# Patient Record
Sex: Male | Born: 1975 | Marital: Married | Smoking: Never smoker
Health system: Southern US, Community
[De-identification: ages and names within clinical notes are randomized; demographics above are authoritative.]

## PROBLEM LIST (undated history)

## (undated) DIAGNOSIS — R200 Anesthesia of skin: Secondary | ICD-10-CM

## (undated) DIAGNOSIS — R29898 Other symptoms and signs involving the musculoskeletal system: Secondary | ICD-10-CM

## (undated) DIAGNOSIS — R202 Paresthesia of skin: Secondary | ICD-10-CM

## (undated) DIAGNOSIS — K0889 Other specified disorders of teeth and supporting structures: Secondary | ICD-10-CM

## (undated) DIAGNOSIS — I2510 Atherosclerotic heart disease of native coronary artery without angina pectoris: Secondary | ICD-10-CM

## (undated) DIAGNOSIS — R6883 Chills (without fever): Secondary | ICD-10-CM

## (undated) HISTORY — DX: Paresthesia of skin: R20.2

## (undated) HISTORY — DX: Other symptoms and signs involving the musculoskeletal system: R29.898

## (undated) HISTORY — DX: Other specified disorders of teeth and supporting structures: K08.89

## (undated) HISTORY — DX: Anesthesia of skin: R20.0

## (undated) HISTORY — PX: OTHER SURGICAL HISTORY: SHX169

## (undated) HISTORY — DX: Chills (without fever): R68.83

---

## 2018-02-08 ENCOUNTER — Emergency Department (HOSPITAL_COMMUNITY)
Admission: EM | Admit: 2018-02-08 | Discharge: 2018-02-08 | Disposition: A | Discharge status: Home / Self Care | Payer: BLUE CROSS/BLUE SHIELD | Attending: Emergency Medicine | Admitting: Emergency Medicine

## 2018-02-08 ENCOUNTER — Encounter (HOSPITAL_COMMUNITY): Payer: Self-pay | Admitting: *Deleted

## 2018-02-08 DIAGNOSIS — R1011 Right upper quadrant pain: Secondary | ICD-10-CM | POA: Diagnosis present

## 2018-02-08 DIAGNOSIS — I2510 Atherosclerotic heart disease of native coronary artery without angina pectoris: Secondary | ICD-10-CM | POA: Diagnosis not present

## 2018-02-08 DIAGNOSIS — M546 Pain in thoracic spine: Secondary | ICD-10-CM | POA: Insufficient documentation

## 2018-02-08 LAB — URINALYSIS, ROUTINE W REFLEX MICROSCOPIC
Bilirubin Urine: NEGATIVE
Glucose, UA: NEGATIVE mg/dL
Hgb urine dipstick: NEGATIVE
Ketones, ur: NEGATIVE mg/dL
LEUKOCYTES UA: NEGATIVE
NITRITE: NEGATIVE
PROTEIN: NEGATIVE mg/dL
Specific Gravity, Urine: 1.015 (ref 1.005–1.030)
pH: 5 (ref 5.0–8.0)

## 2018-02-08 NOTE — ED Notes (Signed)
Pt A&OX4, ambulatory at d/c with independent steady gait, NAD. Pt verbalized understanding of d/c instructions and follow up care. Pt states he has all of his belongings with him at d/c.

## 2018-02-08 NOTE — ED Provider Notes (Signed)
[facility]  CSN: [id number] Arrival date & time: 02/08/18 [id number]  Chief Complaint(s) Flank Pain  HPI Jake Schultz is a 42 y.o. male with a history of CAD status post stenting who presents to the ED with several months of mild intermittent right flank pain.  Patient describes the pain as a dull achiness, feels like he needs to pop his back.  Pain  at times radiates to the left groin.  He does notice pain more with certain positions and with twisting.  Denies any trauma or heavy lifting.  Denies any bowel/bladder incontinence.  No lower extremity weakness or loss of sensation.  Denies any history of cancer, IV drug use, instrumentation.  Denies any history of renal stones.  No dysuria or change in the urine color.  Denies any alleviating or aggravating factors.  Denies any other physical complaints.  HPI  Past Medical History History reviewed. No pertinent past medical history. There are no active problems to display for this patient.  Home Medication(s) Prior to Admission medications   Not on File                                                                                                                                    Past Surgical History Past Surgical History:  Procedure Laterality Date  . CARDIAC SURGERY     Family History No family history on file.  Social History Social History   Tobacco Use  . Smoking status: Not on file  Substance Use Topics  . Alcohol use: Not on file  . Drug use: Not on file   Allergies Patient has no known allergies.  Review of Systems Review of Systems All other systems are reviewed and are negative for acute change except as noted in the HPI  Physical Exam Vital Signs  I have reviewed the triage vital signs BP (!) 124/92 (BP Location: Right Arm)   Pulse 84   Temp 98.3 F (36.8 C) (Oral)   Resp 18   SpO2 99%   Physical Exam  Constitutional: He is oriented to person, place,  and time. He appears well-developed and well-nourished. No distress.  HENT:  Head: Normocephalic and atraumatic.  Right Ear: External ear normal.  Left Ear: External ear normal.  Nose: Nose normal.  Mouth/Throat: Mucous membranes are normal. No trismus in the jaw.  Eyes: Conjunctivae and EOM are normal. No scleral icterus.  Neck: Normal range of motion and phonation normal.  Cardiovascular: Normal rate and regular rhythm.  Pulmonary/Chest: Effort normal. No stridor. No respiratory distress.  Abdominal: He exhibits no distension. There is no tenderness. There is no rigidity, no rebound, no guarding and no CVA tenderness.  Musculoskeletal: Normal range of motion. He exhibits no edema.       Thoracic back: He exhibits pain. He exhibits no tenderness and no bony tenderness.       Back:  Neurological:  He is alert and oriented to person, place, and time.  Spine Exam: Strength: 5/5 throughout LE bilaterally  Sensation: Intact to light touch in proximal and distal LE bilaterally    Skin: He is not diaphoretic.  Psychiatric: He has a normal mood and affect. His behavior is normal.  Vitals reviewed.   ED Results and Treatments Labs (all labs ordered are listed, but only abnormal results are displayed) Labs Reviewed  URINALYSIS, ROUTINE W REFLEX MICROSCOPIC                                                                                                                         EKG  EKG Interpretation  Date/Time:    Ventricular Rate:    PR Interval:    QRS Duration:   QT Interval:    QTC Calculation:   R Axis:     Text Interpretation:        Radiology No results found. Pertinent labs & imaging results that were available during my care of the patient were reviewed by me and considered in my medical decision making (see chart for details).  Medications Ordered in ED Medications - No data to display                                                                                                                                   Procedures Procedures EMERGENCY DEPARTMENT US RENAL EXAM  "Study: Limited Retroperitoneal Ultrasound of Kidneys"  INDICATIONS: Flank pain Long and short axis of both kidneys were obtained.   PERFORMED BY: Myself IMAGES ARCHIVED?: Yes LIMITATIONS: Body habitus VIEWS USED: Long axis and Short axis  INTERPRETATION: No Hydronephrosis, No Renal cyst, No Kidney stone   EMERGENCY DEPARTMENT ULTRASOUND  Study: Limited Retroperitoneal Ultrasound of the Abdominal Aorta.  INDICATIONS:Back pain Multiple views of the abdominal aorta were obtained in real-time from the diaphragmatic hiatus to the aortic bifurcation in transverse planes with a multi-frequency probe.  PERFORMED BY: Myself IMAGES ARCHIVED?: Yes LIMITATIONS:  None INTERPRETATION:  No abdominal aortic aneurysm    (including critical care time)  Medical Decision Making / ED Course I have reviewed the nursing notes for this encounter and the patient's prior records (if available in EHR or on provided paperwork).    42 y.o. male presents with back pain in thoracolumbar area for 3 months without signs of radicular pain. No acute traumatic onset. No red flag symptoms of  fever, weight loss, saddle anesthesia, weakness, fecal/urinary incontinence or urinary retention.   UA is without evidence of hematuria or infection.  Bedside ultrasound without evidence of hydronephrosis or renal stones.  No evidence of AAA.  Suspect MSK etiology. No indication for imaging emergently. Patient was recommended to take short course of scheduled NSAIDs and engage in early mobility as definitive treatment. Return precautions discussed for worsening or new concerning symptoms.    Final Clinical Impression(s) / ED Diagnoses Final diagnoses:  Acute right-sided thoracic back pain    Disposition: Discharge  Condition: Good  I have discussed the results, Dx and Tx plan with the patient who expressed  understanding and agree(s) with the plan. Discharge instructions discussed at great length. The patient was given strict return precautions who verbalized understanding of the instructions. No further questions at time of discharge.    ED Discharge Orders    None       Follow Up: Primary care provider   in 5-7 days, If symptoms do not improve or  worsen     This chart was dictated using voice recognition software.  Despite best efforts to proofread,  errors can occur which can change the documentation meaning.   Nira Conn, MD 02/08/18 1039

## 2018-02-08 NOTE — ED Triage Notes (Signed)
Pt reports rt flank pain for 1 week. Pt reports diarrhea after drinking cranberry juice. Pt denies any urinary symptoms. Denies any recent back issues as well.

## 2018-10-05 ENCOUNTER — Emergency Department (HOSPITAL_COMMUNITY)
Admission: EM | Admit: 2018-10-05 | Discharge: 2018-10-05 | Disposition: A | Discharge status: Home / Self Care | Payer: BLUE CROSS/BLUE SHIELD | Attending: Emergency Medicine | Admitting: Emergency Medicine

## 2018-10-05 ENCOUNTER — Emergency Department (HOSPITAL_COMMUNITY): Payer: BLUE CROSS/BLUE SHIELD

## 2018-10-05 ENCOUNTER — Encounter (HOSPITAL_COMMUNITY): Payer: Self-pay | Admitting: *Deleted

## 2018-10-05 DIAGNOSIS — R1031 Right lower quadrant pain: Secondary | ICD-10-CM | POA: Insufficient documentation

## 2018-10-05 DIAGNOSIS — R1011 Right upper quadrant pain: Secondary | ICD-10-CM | POA: Insufficient documentation

## 2018-10-05 DIAGNOSIS — R109 Unspecified abdominal pain: Secondary | ICD-10-CM

## 2018-10-05 DIAGNOSIS — R1033 Periumbilical pain: Secondary | ICD-10-CM | POA: Diagnosis not present

## 2018-10-05 DIAGNOSIS — Z79899 Other long term (current) drug therapy: Secondary | ICD-10-CM | POA: Insufficient documentation

## 2018-10-05 LAB — LIPASE, BLOOD: LIPASE: 35 U/L (ref 11–51)

## 2018-10-05 LAB — COMPREHENSIVE METABOLIC PANEL
ALT: 43 U/L (ref 0–44)
AST: 29 U/L (ref 15–41)
Albumin: 4.2 g/dL (ref 3.5–5.0)
Alkaline Phosphatase: 71 U/L (ref 38–126)
Anion gap: 10 (ref 5–15)
BUN: 8 mg/dL (ref 6–20)
CHLORIDE: 102 mmol/L (ref 98–111)
CO2: 26 mmol/L (ref 22–32)
CREATININE: 0.92 mg/dL (ref 0.61–1.24)
Calcium: 9.6 mg/dL (ref 8.9–10.3)
GFR calc Af Amer: >60 mL/min (ref >60)
GFR calc non Af Amer: >60 mL/min (ref >60)
Glucose, Bld: 111 mg/dL — ABNORMAL HIGH (ref 70–99)
POTASSIUM: 4.4 mmol/L (ref 3.5–5.1)
Sodium: 138 mmol/L (ref 135–145)
Total Bilirubin: 1 mg/dL (ref 0.3–1.2)
Total Protein: 7.6 g/dL (ref 6.5–8.1)

## 2018-10-05 LAB — CBC
HCT: 49.7 % (ref 39.0–52.0)
HEMOGLOBIN: 17 g/dL (ref 13.0–17.0)
MCH: 29.7 pg (ref 26.0–34.0)
MCHC: 34.2 g/dL (ref 30.0–36.0)
MCV: 86.9 fL (ref 80.0–100.0)
NRBC: 0 % (ref 0.0–0.2)
Platelets: UNDETERMINED 10*3/uL (ref 150–400)
RBC: 5.72 MIL/uL (ref 4.22–5.81)
RDW: 11.7 % (ref 11.5–15.5)
WBC: 9.4 10*3/uL (ref 4.0–10.5)

## 2018-10-05 LAB — URINALYSIS, ROUTINE W REFLEX MICROSCOPIC
Bilirubin Urine: NEGATIVE
GLUCOSE, UA: NEGATIVE mg/dL
Hgb urine dipstick: NEGATIVE
KETONES UR: NEGATIVE mg/dL
LEUKOCYTES UA: NEGATIVE
NITRITE: NEGATIVE
PH: 6 (ref 5.0–8.0)
Protein, ur: NEGATIVE mg/dL
Specific Gravity, Urine: <1.005 — ABNORMAL LOW (ref 1.005–1.030)

## 2018-10-05 NOTE — ED Provider Notes (Signed)
[facility] EMERGENCY DEPARTMENT Provider Note   CSN: [id number] Arrival date & time: 10/05/18  4098     History   Chief Complaint Chief Complaint  Patient presents with  . Abdominal Pain  . Back Pain    HPI Jake Schultz is a 42 y.o. male presenting to the ED with complaint of 2 weeks of gradual onset intermittent RLQ/periumbilical abdominal pain. Pt states he has had chronic right sided mid back pain which is unchanged and hx of pulled groin muscle in the spring, though the abdominal pain is new. States it doesn't feel like a pain as much as a "pulled muscle." Sx worse with movement, comes and goes, radiates to RUQ. Intermittent mild nausea, no vomiting, diarrhea, constipation, fever, urinary sx. States he has occasional discomfort in his right testicle, though had a normal testicular U/S done 1 month ago for same sx. Sx are unchanged. He was told he may have a hernia and has been treating homeopathically with "reiki" therapy.  No hx of abdominal surgeries.   The history is provided by the patient.    History reviewed. No pertinent past medical history.  There are no active problems to display for this patient.   Past Surgical History:  Procedure Laterality Date  . CARDIAC SURGERY          Home Medications    Prior to Admission medications   Medication Sig Start Date End Date Taking? Authorizing Provider  Multiple Vitamins-Minerals (MULTIVITAMIN WITH MINERALS) tablet Take 1 tablet by mouth daily.   Yes [provider]    Family History History reviewed. No pertinent family history.  Social History Social History   Tobacco Use  . Smoking status: Not on file  Substance Use Topics  . Alcohol use: Not on file  . Drug use: Not on file     Allergies   Patient has no known allergies.   Review of Systems Review of Systems  Constitutional: Negative for appetite change and fever.  Gastrointestinal: Positive for abdominal pain and  nausea. Negative for blood in stool, constipation, diarrhea and vomiting.  Genitourinary: Positive for testicular pain. Negative for discharge, dysuria, frequency, penile pain and scrotal swelling.  Musculoskeletal: Positive for back pain.  All other systems reviewed and are negative.    Physical Exam Updated Vital Signs BP (!) 131/94   Pulse 73   Temp 98.2 F (36.8 C) (Oral)   Resp 18   Ht 5\' 8"  (1.727 m)   Wt 87.1 kg   SpO2 97%   BMI 29.19 kg/m   Physical Exam  Constitutional: He appears well-developed and well-nourished.  Non-toxic appearance. He does not appear ill. No distress.  HENT:  Head: Normocephalic and atraumatic.  Eyes: Conjunctivae are normal.  Cardiovascular: Normal rate, regular rhythm and normal heart sounds.  Pulmonary/Chest: Effort normal and breath sounds normal. No respiratory distress.  Abdominal: Soft. Normal appearance and bowel sounds are normal. He exhibits no distension and no mass. There is tenderness in the right upper quadrant, right lower quadrant and periumbilical area. There is positive Murphy's sign. There is no rigidity, no rebound and no guarding. A hernia is present. Hernia confirmed positive in the right inguinal area (possible small right inguinal hernia, nontender).  Genitourinary: Right testis shows no mass, no swelling and no tenderness. Left testis shows no mass, no swelling and no tenderness. Uncircumcised.  Neurological: He is alert.  Skin: Skin is warm.  Psychiatric: He has a normal mood and affect. His behavior is  normal.  Nursing note and vitals reviewed.    ED Treatments / Results  Labs (all labs ordered are listed, but only abnormal results are displayed) Labs Reviewed  COMPREHENSIVE METABOLIC PANEL - Abnormal; Notable for the following components:      Result Value   Glucose, Bld 111 (*)    All other components within normal limits  URINALYSIS, ROUTINE W REFLEX MICROSCOPIC - Abnormal; Notable for the following components:     Specific Gravity, Urine <1.005 (*)    All other components within normal limits  LIPASE, BLOOD  CBC    EKG None  Radiology US Abdomen Limited Ruq  Result Date: 10/05/2018 CLINICAL DATA:  42 year old male with abdominal pain for 2.5 months. EXAM: ULTRASOUND ABDOMEN LIMITED RIGHT UPPER QUADRANT COMPARISON:  Report of [facility] CT Abdomen and Pelvis 03/22/2012. FINDINGS: Gallbladder: No gallstones or wall thickening visualized. No sonographic Murphy sign noted by sonographer. Common bile duct: Diameter: 2 millimeters, normal. Liver: Echogenic liver (image 23). No discrete liver lesion. No intrahepatic biliary ductal dilatation. Portal vein is patent on color Doppler imaging with normal direction of blood flow towards the liver. Other findings: Negative visible right kidney. IMPRESSION: 1. Fatty liver disease. 2. Normal gallbladder.  No evidence of bile duct obstruction. Electronically Signed   By: Odessa Fleming M.D.   On: 10/05/2018 13:52    Procedures Procedures (including critical care time)  Medications Ordered in ED Medications - No data to display   Initial Impression / Assessment and Plan / ED Course  I have reviewed the triage vital signs and the nursing notes.  Pertinent labs & imaging results that were available during my care of the patient were reviewed by me and considered in my medical decision making (see chart for details).     Patient with 2 weeks of intermittent mild periumbilical/right lower quadrant/right upper quadrant abdominal pain that is worse with movement.  States it feels like a pulled muscle.  No other abdominal complaints.  Patient may have small inguinal hernia on the right on exam.  Recent normal testicular/scrotal ultrasound and no new symptoms regarding.  Exam is nonfocal without peritoneal signs.  Labs without leukocytosis, CMP within normal limits, lipase within normal limits, UA is negative.  Vital signs are stable.  Right upper quadrant  ultrasound to evaluate for possible cholelithiasis is negative.  Patient discussed with and evaluated by Dr. Rhunette Croft.  Had shared decision making with patient regarding nonemergent CT scan for diagnosis.  Do not see evidence of acute abdomen today in the emergency department, and feel comfortable with discharge and outpatient CT scan for further evaluation.  Patient agreeable to plan with discharge and outpatient CT versus CT today in the emergency department.  Will provide primary care referral as needed.  Discussed strict return precautions, and patient verbalized understanding of these precautions and agrees to plan for discharge.  Discussed results, findings, treatment and follow up. Patient advised of return precautions. Patient verbalized understanding and agreed with plan.  Final Clinical Impressions(s) / ED Diagnoses   Final diagnoses:  Abdominal pain    ED Discharge Orders    None       Colin Ellers, [place] N, PA-C 10/05/18 1527    Derwood Kaplan, MD 10/07/18 [phone]

## 2018-10-05 NOTE — Discharge Instructions (Signed)
You can take Tylenol or ibuprofen as needed for abdominal pain. Schedule appointment with your primary care provider for further evaluation and possible outpatient CT scan. Return to the emergency department immediately if you develop fever, uncontrollable vomiting, if you stop having bowel movements and passing gas, severely worsening abdominal pain, or new or concerning symptoms.

## 2018-10-05 NOTE — ED Triage Notes (Signed)
Pt in c/o right lower quad abdominal pain and right flank pain, symptoms have been going on for the last month and he has been seem multiples times for this, symptoms are not improving and they have not found a reason for symptoms, no distress noted

## 2018-10-05 NOTE — ED Notes (Signed)
Patient transported to Ultrasound 

## 2018-10-05 NOTE — ED Notes (Signed)
Patient verbalizes understanding of discharge instructions. Opportunity for questioning and answers were provided. Armband removed by staff, pt discharged from ED. Pt ambulatory to lobby.  

## 2018-11-25 ENCOUNTER — Emergency Department (HOSPITAL_COMMUNITY): Payer: BLUE CROSS/BLUE SHIELD

## 2018-11-25 ENCOUNTER — Encounter (HOSPITAL_COMMUNITY): Payer: Self-pay | Admitting: Emergency Medicine

## 2018-11-25 ENCOUNTER — Other Ambulatory Visit: Payer: Self-pay

## 2018-11-25 ENCOUNTER — Observation Stay (HOSPITAL_COMMUNITY)
Admission: EM | Admit: 2018-11-25 | Discharge: 2018-11-27 | Disposition: A | Discharge status: Home / Self Care | Payer: BLUE CROSS/BLUE SHIELD | Attending: Surgery | Admitting: Surgery

## 2018-11-25 DIAGNOSIS — Z955 Presence of coronary angioplasty implant and graft: Secondary | ICD-10-CM | POA: Diagnosis not present

## 2018-11-25 DIAGNOSIS — I2510 Atherosclerotic heart disease of native coronary artery without angina pectoris: Secondary | ICD-10-CM | POA: Insufficient documentation

## 2018-11-25 DIAGNOSIS — Z79899 Other long term (current) drug therapy: Secondary | ICD-10-CM | POA: Insufficient documentation

## 2018-11-25 DIAGNOSIS — R1031 Right lower quadrant pain: Secondary | ICD-10-CM

## 2018-11-25 DIAGNOSIS — I10 Essential (primary) hypertension: Secondary | ICD-10-CM | POA: Diagnosis not present

## 2018-11-25 DIAGNOSIS — K37 Unspecified appendicitis: Secondary | ICD-10-CM | POA: Diagnosis present

## 2018-11-25 DIAGNOSIS — Z7902 Long term (current) use of antithrombotics/antiplatelets: Secondary | ICD-10-CM | POA: Insufficient documentation

## 2018-11-25 DIAGNOSIS — K3580 Unspecified acute appendicitis: Principal | ICD-10-CM | POA: Insufficient documentation

## 2018-11-25 DIAGNOSIS — Z01810 Encounter for preprocedural cardiovascular examination: Secondary | ICD-10-CM

## 2018-11-25 HISTORY — DX: Atherosclerotic heart disease of native coronary artery without angina pectoris: I25.10

## 2018-11-25 LAB — COMPREHENSIVE METABOLIC PANEL
ALBUMIN: 4.2 g/dL (ref 3.5–5.0)
ALT: 34 U/L (ref 0–44)
ANION GAP: 8 (ref 5–15)
AST: 28 U/L (ref 15–41)
Alkaline Phosphatase: 67 U/L (ref 38–126)
BILIRUBIN TOTAL: 0.8 mg/dL (ref 0.3–1.2)
BUN: 13 mg/dL (ref 6–20)
CHLORIDE: 104 mmol/L (ref 98–111)
CO2: 24 mmol/L (ref 22–32)
Calcium: 9.5 mg/dL (ref 8.9–10.3)
Creatinine, Ser: 1.04 mg/dL (ref 0.61–1.24)
GFR calc Af Amer: >60 mL/min (ref >60)
Glucose, Bld: 113 mg/dL — ABNORMAL HIGH (ref 70–99)
POTASSIUM: 3.9 mmol/L (ref 3.5–5.1)
Sodium: 136 mmol/L (ref 135–145)
TOTAL PROTEIN: 7.3 g/dL (ref 6.5–8.1)

## 2018-11-25 LAB — URINALYSIS, ROUTINE W REFLEX MICROSCOPIC
Bilirubin Urine: NEGATIVE
Glucose, UA: NEGATIVE mg/dL
Hgb urine dipstick: NEGATIVE
Ketones, ur: NEGATIVE mg/dL
LEUKOCYTES UA: NEGATIVE
Nitrite: NEGATIVE
PROTEIN: NEGATIVE mg/dL
Specific Gravity, Urine: 1.016 (ref 1.005–1.030)
pH: 6 (ref 5.0–8.0)

## 2018-11-25 LAB — CBC WITH DIFFERENTIAL/PLATELET
Abs Immature Granulocytes: 0.07 10*3/uL (ref 0.00–0.07)
BASOS ABS: 0.1 10*3/uL (ref 0.0–0.1)
BASOS PCT: 0 %
EOS ABS: 0.2 10*3/uL (ref 0.0–0.5)
Eosinophils Relative: 1 %
HEMATOCRIT: 49.3 % (ref 39.0–52.0)
Hemoglobin: 17.3 g/dL — ABNORMAL HIGH (ref 13.0–17.0)
Immature Granulocytes: 1 %
Lymphocytes Relative: 15 %
Lymphs Abs: 2.3 10*3/uL (ref 0.7–4.0)
MCH: 30 pg (ref 26.0–34.0)
MCHC: 35.1 g/dL (ref 30.0–36.0)
MCV: 85.6 fL (ref 80.0–100.0)
MONOS PCT: 6 %
Monocytes Absolute: 1 10*3/uL (ref 0.1–1.0)
Neutro Abs: 11.5 10*3/uL — ABNORMAL HIGH (ref 1.7–7.7)
Neutrophils Relative %: 77 %
Platelets: 171 10*3/uL (ref 150–400)
RBC: 5.76 MIL/uL (ref 4.22–5.81)
RDW: 11.7 % (ref 11.5–15.5)
WBC: 15.2 10*3/uL — ABNORMAL HIGH (ref 4.0–10.5)
nRBC: 0 % (ref 0.0–0.2)

## 2018-11-25 MED ORDER — DIPHENHYDRAMINE HCL 12.5 MG/5ML PO ELIX: 12.5000 mg | ORAL_SOLUTION | Freq: Four times a day (QID) | ORAL | Status: DC | PRN

## 2018-11-25 MED ORDER — SODIUM CHLORIDE 0.9 % IV SOLN
INTRAVENOUS | Status: DC
Start: 2018-11-25 — End: 2018-11-25
  Administered 2018-11-25: 06:00:00 via INTRAVENOUS

## 2018-11-25 MED ORDER — OXYCODONE HCL 5 MG PO TABS: 5.0000 mg | ORAL_TABLET | ORAL | Status: DC | PRN

## 2018-11-25 MED ORDER — HYDRALAZINE HCL 20 MG/ML IJ SOLN: 10.0000 mg | INTRAMUSCULAR | Status: DC | PRN

## 2018-11-25 MED ORDER — MORPHINE SULFATE (PF) 2 MG/ML IV SOLN: 2.0000 mg | INTRAVENOUS | Status: DC | PRN

## 2018-11-25 MED ORDER — ONDANSETRON 4 MG PO TBDP: 4.0000 mg | ORAL_TABLET | Freq: Four times a day (QID) | ORAL | Status: DC | PRN

## 2018-11-25 MED ORDER — TRAMADOL HCL 50 MG PO TABS
50.0000 mg | ORAL_TABLET | Freq: Four times a day (QID) | ORAL | Status: DC | PRN
  Filled 2018-11-25: qty 1

## 2018-11-25 MED ORDER — LACTATED RINGERS IV SOLN
INTRAVENOUS | Status: DC
  Administered 2018-11-25 – 2018-11-26 (×2): via INTRAVENOUS

## 2018-11-25 MED ORDER — PIPERACILLIN-TAZOBACTAM 3.375 G IVPB
3.3750 g | Freq: Three times a day (TID) | INTRAVENOUS | Status: DC
  Administered 2018-11-25 – 2018-11-26 (×4): 3.375 g via INTRAVENOUS
  Filled 2018-11-25 (×4): qty 50

## 2018-11-25 MED ORDER — IBUPROFEN 600 MG PO TABS
600.0000 mg | ORAL_TABLET | Freq: Four times a day (QID) | ORAL | Status: DC | PRN
  Filled 2018-11-25: qty 1

## 2018-11-25 MED ORDER — ACETAMINOPHEN 500 MG PO TABS: 1000.0000 mg | ORAL_TABLET | Freq: Four times a day (QID) | ORAL | Status: DC

## 2018-11-25 MED ORDER — METRONIDAZOLE IN NACL 5-0.79 MG/ML-% IV SOLN: 500.0000 mg | Freq: Three times a day (TID) | INTRAVENOUS | Status: DC

## 2018-11-25 MED ORDER — DIPHENHYDRAMINE HCL 50 MG/ML IJ SOLN: 25.0000 mg | Freq: Four times a day (QID) | INTRAMUSCULAR | Status: DC | PRN

## 2018-11-25 MED ORDER — HYDROMORPHONE HCL 1 MG/ML IJ SOLN: 0.5000 mg | INTRAMUSCULAR | Status: DC | PRN

## 2018-11-25 MED ORDER — ENOXAPARIN SODIUM 40 MG/0.4ML ~~LOC~~ SOLN: 40.0000 mg | SUBCUTANEOUS | Status: DC

## 2018-11-25 MED ORDER — DIPHENHYDRAMINE HCL 25 MG PO CAPS: 25.0000 mg | ORAL_CAPSULE | Freq: Four times a day (QID) | ORAL | Status: DC | PRN

## 2018-11-25 MED ORDER — SODIUM CHLORIDE 0.9 % IV SOLN: 2.0000 g | INTRAVENOUS | Status: DC

## 2018-11-25 MED ORDER — POTASSIUM CHLORIDE IN NACL 20-0.9 MEQ/L-% IV SOLN
INTRAVENOUS | Status: DC
  Filled 2018-11-25: qty 1000

## 2018-11-25 MED ORDER — SODIUM CHLORIDE 0.9 % IV SOLN
3.0000 g | Freq: Once | INTRAVENOUS | Status: AC
  Administered 2018-11-25: 3 g via INTRAVENOUS
  Filled 2018-11-25: qty 3

## 2018-11-25 MED ORDER — DIPHENHYDRAMINE HCL 50 MG/ML IJ SOLN: 12.5000 mg | Freq: Four times a day (QID) | INTRAMUSCULAR | Status: DC | PRN

## 2018-11-25 MED ORDER — FAMOTIDINE 20 MG PO TABS: 20.0000 mg | ORAL_TABLET | Freq: Two times a day (BID) | ORAL | Status: DC

## 2018-11-25 MED ORDER — IOHEXOL 300 MG/ML  SOLN
100.0000 mL | Freq: Once | INTRAMUSCULAR | Status: AC
  Administered 2018-11-25: 100 mL via INTRAVENOUS

## 2018-11-25 MED ORDER — ACETAMINOPHEN 500 MG PO TABS
1000.0000 mg | ORAL_TABLET | Freq: Four times a day (QID) | ORAL | Status: DC
  Administered 2018-11-25 – 2018-11-27 (×6): 1000 mg via ORAL
  Filled 2018-11-25 (×8): qty 2

## 2018-11-25 MED ORDER — ONDANSETRON HCL 4 MG/2ML IJ SOLN: 4.0000 mg | Freq: Four times a day (QID) | INTRAMUSCULAR | Status: DC | PRN

## 2018-11-25 MED ORDER — ENOXAPARIN SODIUM 40 MG/0.4ML ~~LOC~~ SOLN
40.0000 mg | SUBCUTANEOUS | Status: DC
  Administered 2018-11-25 – 2018-11-26 (×2): 40 mg via SUBCUTANEOUS
  Filled 2018-11-25 (×2): qty 0.4

## 2018-11-25 MED ORDER — DOCUSATE SODIUM 100 MG PO CAPS: 100.0000 mg | ORAL_CAPSULE | Freq: Two times a day (BID) | ORAL | Status: DC

## 2018-11-25 NOTE — ED Triage Notes (Signed)
Pt states that he has abdominal pain early this morning, he had a BM and then continued to feel bad. He reports sweating once during this episode. Pain is in lower right quad and hurts when he presses.

## 2018-11-25 NOTE — ED Notes (Signed)
ED Provider at bedside. 

## 2018-11-25 NOTE — ED Provider Notes (Signed)
TIME SEEN: 5:19 AM  CHIEF COMPLAINT: Right lower quadrant pain  HPI: Patient is a 42 year old male with h/o CAD with stent who presents to the emergency department with right lower quadrant pain that started about 2 hours ago.  Describes it as a pressure that is worse with palpation and started while he was sleeping.  No radiation of pain.  Reports he has had intermittent right testicle pain for several months.  Had an ultrasound which she reports was unremarkable [facility] 2 to [facility].  No testicular pain currently.  States he did have a bowel movement prior to arrival which was normal without blood.  Denies any fevers, nausea, vomiting, diarrhea, dysuria, hematuria, penile discharge.  He was concerned that he may have appendicitis.  ROS: See HPI Constitutional: no fever  Eyes: no drainage  ENT: no runny nose   Cardiovascular:  no chest pain  Resp: no SOB  GI: no vomiting GU: no dysuria Integumentary: no rash  Allergy: no hives  Musculoskeletal: no leg swelling  Neurological: no slurred speech ROS otherwise negative  PAST MEDICAL HISTORY/PAST SURGICAL HISTORY:  CAD  MEDICATIONS:  Prior to Admission medications   Medication Sig Start Date End Date Taking? Authorizing Provider  Multiple Vitamins-Minerals (MULTIVITAMIN WITH MINERALS) tablet Take 1 tablet by mouth daily.    [provider]    ALLERGIES:  No Known Allergies  SOCIAL HISTORY:  Social History   Tobacco Use  . Smoking status: Not on file  Substance Use Topics  . Alcohol use: Not on file    FAMILY HISTORY: No family history on file.  EXAM: BP (!) 136/96 (BP Location: Right Arm)   Pulse 97   Temp 99.7 F (37.6 C) (Oral)   Resp 18   Ht 5\' 7"  (1.702 m)   Wt 83.9 kg   SpO2 97%   BMI 28.98 kg/m  CONSTITUTIONAL: Alert and oriented and responds appropriately to questions. Well-appearing; well-nourished HEAD: Normocephalic EYES: Conjunctivae clear, pupils appear equal,  EOMI ENT: normal nose; moist mucous membranes NECK: Supple, no meningismus, no nuchal rigidity, no LAD  CARD: RRR; S1 and S2 appreciated; no murmurs, no clicks, no rubs, no gallops RESP: Normal chest excursion without splinting or tachypnea; breath sounds clear and equal bilaterally; no wheezes, no rhonchi, no rales, no hypoxia or respiratory distress, speaking full sentences ABD/GI: Normal bowel sounds; non-distended; soft, tender to palpation at McBurney's point with voluntary guarding, negative Murphy sign GU:  Normal external genitalia, circumcised male, normal penile shaft, no blood or discharge at the urethral meatus, no testicular masses or tenderness on exam, no scrotal masses or swelling, no hernias appreciated, 2+ femoral pulses bilaterally; no perineal erythema, warmth, subcutaneous air or crepitus; no high riding testicle, normal bilateral cremasteric reflex.  Chaperone present for exam. BACK:  The back appears normal and is non-tender to palpation, there is no CVA tenderness EXT: Normal ROM in all joints; non-tender to palpation; no edema; normal capillary refill; no cyanosis, no calf tenderness or swelling    SKIN: Normal color for age and race; warm; no rash NEURO: Moves all extremities equally PSYCH: The patient's mood and manner are appropriate. Grooming and personal hygiene are appropriate.  MEDICAL DECISION MAKING: Patient here with right lower quadrant tenderness.  I am also concerned that the patient may have appendicitis.  Differential also includes kidney stone, colitis, diverticulitis.  Will obtain labs, urine, CT of abdomen pelvis.  He declines pain and nausea medicine at this time.  ED  PROGRESS: Labs show leukocytosis with left shift.  Urine shows no blood or sign of infection.  CT of the abdomen pelvis pending.  Signed out to Dr. Rosalia Hammers to follow-up on patient's imaging.   I reviewed all nursing notes, vitals, pertinent previous records, EKGs, lab and urine results, imaging  (as available).      Bubba Vanbenschoten, Layla Maw, DO 11/25/18 [phone]

## 2018-11-25 NOTE — ED Notes (Signed)
Patient transported to CT 

## 2018-11-25 NOTE — H&P (Signed)
[facility] Consult/Admission Note  Jake BordersMiguel Schultz 03/11/1976  [id number].    Requesting MD: Dr. Rosalia Hammersay Chief Complaint/Reason for Consult: appendicitis  HPI:   Pt is a 42 yo male with a hx of MI and stent in 2012 who hasn't seen his cardiologist (Dr. Arlester MarkerKraoswski?? [place] [facility]) in 3-4 years and stopped taking his plavix around the same time, who presented to the ED with abdominal pain. Pt states he has been having intermittent RLQ for 3 months but this am it was severe, radiating into his lower back, sharp, with associated chills. He denies diarrhea, fever, nausea or vomiting. No hx of abdominal surgeries. Pt denies CP, SOB, dizziness, LOC. CT scan showed early appendicitis. WBC 15.2.  ROS:  Review of Systems  Constitutional: Positive for chills. Negative for diaphoresis and fever.  HENT: Negative for sore throat.   Respiratory: Negative for cough and shortness of breath.   Cardiovascular: Negative for chest pain.  Gastrointestinal: Positive for abdominal pain. Negative for blood in stool, constipation, diarrhea, nausea and vomiting.  Genitourinary: Negative for dysuria.  Skin: Negative for rash.  Neurological: Negative for dizziness and loss of consciousness.  All other systems reviewed and are negative.    No family history on file.  History reviewed. No pertinent past medical history.  Past Surgical History:  Procedure Laterality Date  . CARDIAC SURGERY      Social History:  has no tobacco, alcohol, and drug history on file.  Allergies: No Known Allergies   (Not in a hospital admission)  Blood pressure 124/87, pulse (!) 108, temperature 99.7 F (37.6 C), temperature source Oral, resp. rate 16, height 5\' 7"  (1.702 m), weight 83.9 kg, SpO2 98 %.  Physical Exam  Constitutional: He is oriented to person, place, and time. He appears well-developed and well-nourished.  Non-toxic appearance. He does not appear ill. No distress.  HENT:  Head: Normocephalic and  atraumatic.  Nose: Nose normal.  Mouth/Throat: Oropharynx is clear and moist and mucous membranes are normal. No oropharyngeal exudate.  Eyes: Pupils are equal, round, and reactive to light. Conjunctivae are normal. Right eye exhibits no discharge. Left eye exhibits no discharge. No scleral icterus.  Neck: Normal range of motion. Neck supple. No thyromegaly present.  Cardiovascular: Normal rate, regular rhythm, normal heart sounds and intact distal pulses.  No murmur heard. Pulses:      Radial pulses are 2+ on the right side, and 2+ on the left side.       Dorsalis pedis pulses are 2+ on the right side, and 2+ on the left side.  Pulmonary/Chest: Effort normal and breath sounds normal. No respiratory distress. He has no wheezes. He has no rhonchi. He has no rales.  Abdominal: Soft. Normal appearance and bowel sounds are normal. He exhibits no distension. There is no hepatosplenomegaly. There is tenderness in the right lower quadrant. There is guarding and tenderness at McBurney's point. There is no rigidity.  Musculoskeletal: Normal range of motion. He exhibits no edema, tenderness or deformity.  Lymphadenopathy:    He has no cervical adenopathy.  Neurological: He is alert and oriented to person, place, and time.  Skin: Skin is warm and dry. No rash noted. He is not diaphoretic.  Psychiatric: He has a normal mood and affect.  Nursing note and vitals reviewed.   Results for orders placed or performed during the hospital encounter of 11/25/18 (from the past 48 hour(s))  CBC with Differential     Status: Abnormal   Collection Time: 11/25/18  5:35 AM  Result Value Ref Range   WBC 15.2 (H) 4.0 - 10.5 K/uL   RBC 5.76 4.22 - 5.81 MIL/uL   Hemoglobin 17.3 (H) 13.0 - 17.0 g/dL   HCT 16.1 09.6 - 04.5 %   MCV 85.6 80.0 - 100.0 fL   MCH 30.0 26.0 - 34.0 pg   MCHC 35.1 30.0 - 36.0 g/dL   RDW 40.9 81.1 - 91.4 %   Platelets 171 150 - 400 K/uL   nRBC 0.0 0.0 - 0.2 %   Neutrophils Relative % 77 %    Neutro Abs 11.5 (H) 1.7 - 7.7 K/uL   Lymphocytes Relative 15 %   Lymphs Abs 2.3 0.7 - 4.0 K/uL   Monocytes Relative 6 %   Monocytes Absolute 1.0 0.1 - 1.0 K/uL   Eosinophils Relative 1 %   Eosinophils Absolute 0.2 0.0 - 0.5 K/uL   Basophils Relative 0 %   Basophils Absolute 0.1 0.0 - 0.1 K/uL   Immature Granulocytes 1 %   Abs Immature Granulocytes 0.07 0.00 - 0.07 K/uL    Comment: Performed at [facility], [address] [place], [place] [address]  Comprehensive metabolic panel     Status: Abnormal   Collection Time: 11/25/18  5:35 AM  Result Value Ref Range   Sodium 136 135 - 145 mmol/L   Potassium 3.9 3.5 - 5.1 mmol/L   Chloride 104 98 - 111 mmol/L   CO2 24 22 - 32 mmol/L   Glucose, Bld 113 (H) 70 - 99 mg/dL   BUN 13 6 - 20 mg/dL   Creatinine, Ser 6.21 0.61 - 1.24 mg/dL   Calcium 9.5 8.9 - 30.8 mg/dL   Total Protein 7.3 6.5 - 8.1 g/dL   Albumin 4.2 3.5 - 5.0 g/dL   AST 28 15 - 41 U/L   ALT 34 0 - 44 U/L   Alkaline Phosphatase 67 38 - 126 U/L   Total Bilirubin 0.8 0.3 - 1.2 mg/dL   GFR calc non Af Amer >60 >60 mL/min   GFR calc Af Amer >60 >60 mL/min   Anion gap 8 5 - 15    Comment: Performed at [facility], [address] [place], [place] [address]  Urinalysis, Routine w reflex microscopic     Status: None   Collection Time: 11/25/18  5:35 AM  Result Value Ref Range   Color, Urine YELLOW YELLOW   APPearance CLEAR CLEAR   Specific Gravity, Urine 1.016 1.005 - 1.030   pH 6.0 5.0 - 8.0   Glucose, UA NEGATIVE NEGATIVE mg/dL   Hgb urine dipstick NEGATIVE NEGATIVE   Bilirubin Urine NEGATIVE NEGATIVE   Ketones, ur NEGATIVE NEGATIVE mg/dL   Protein, ur NEGATIVE NEGATIVE mg/dL   Nitrite NEGATIVE NEGATIVE   Leukocytes, UA NEGATIVE NEGATIVE    Comment: Performed at [facility], [address], [place], [place] [address]   Ct Abdomen Pelvis W Contrast  Result Date: 11/25/2018 CLINICAL DATA:  42 year old male with history of right lower quadrant  abdominal pain this morning. No associated nausea, vomiting or diarrhea. EXAM: CT ABDOMEN AND PELVIS WITH CONTRAST TECHNIQUE: Multidetector CT imaging of the abdomen and pelvis was performed using the standard protocol following bolus administration of intravenous contrast. CONTRAST:  100 mL of Omnipaque 300. COMPARISON:  No priors. FINDINGS: Lower chest: Unremarkable. Hepatobiliary: No cystic or solid hepatic lesions. No intra or extrahepatic biliary ductal dilatation. Gallbladder is normal in appearance. Pancreas: No pancreatic mass. No pancreatic  ductal dilatation. No pancreatic or peripancreatic fluid or inflammatory changes. Spleen: Unremarkable. Adrenals/Urinary Tract: Bilateral kidneys and adrenal glands are normal in appearance. No hydroureteronephrosis. Urinary bladder is normal in appearance. Stomach/Bowel: Normal appearance of the stomach. No pathologic dilatation of small bowel or colon. The appendix is dilated measuring 11 mm (coronal image 61 of series 6) with mucosal hyperenhancement and subtle surrounding inflammatory changes. No appendicoliths noted. No periappendiceal abscess or signs of frank perforation. Vascular/Lymphatic: No significant atherosclerotic disease, aneurysm or dissection noted in the abdominal or pelvic vasculature. No lymphadenopathy noted in the abdomen or pelvis. Reproductive: Prostate gland and seminal vesicles are unremarkable in appearance. Other: No significant volume of ascites.  No pneumoperitoneum. Musculoskeletal: There are no aggressive appearing lytic or blastic lesions noted in the visualized portions of the skeleton. IMPRESSION: 1. Findings, as above, concerning for early acute appendicitis. No periappendiceal abscess or signs of frank perforation are noted at this time. Electronically Signed   By: Trudie Reed M.D.   On: 11/25/2018 08:13      Assessment/Plan Active Problems:   * No active hospital problems. *  Hx of MI 2012, stent placed and not on  plavix for >3 yrs  Appendicitis - OR today pending cardiac clearance  FEN: NPO VTE: SCD's, lovenox ID: Unasyn once; Rocephin & Flagyl 11/29>> Foley: none Follow up: TBD  Plan: Card clearance pending then likely OR today   Jerre Simon, [facility] 11/25/2018, 9:26 AM Pager: [phone] Consults: [phone] Mon-Fri 7:00 am-4:30 pm Sat-Sun 7:00 am-11:30 am

## 2018-11-25 NOTE — Consult Note (Addendum)
Cardiology Consultation:   Patient ID: Jake Schultz MRN: [id number]; DOB: 03-18-1976  Admit date: 11/25/2018 Date of Consult: 11/25/2018  Primary Care Provider: [facility] Primary Cardiologist: No primary care provider on file. Primary Electrophysiologist:  None    Patient Profile:   Jake Schultz is a 42 y.o. male with a hx of CAD who is being seen today for the evaluation of preoperative clearance at the request of Dr. Cliffton Asters.  History of Present Illness:   Jake Schultz is a 42 year old male with past medical history of CAD but no other significant history who presented was right lower abdominal pain.  According to patient, he was previously followed by Dr. Bing Matter in [facility].  He reported to cardiac catheterization 6 months apart in 2012.  He mentioned during the second cardiac catheterization, he was noted to have a blockage in the back of the heart and received a metal stent.  He was placed on aspirin and Plavix.  The combination of dual antiplatelet therapy was causing a lot of GI issues, his Plavix was discontinued 6 months after the procedure and he came off of aspirin a year after the procedure.  He also came off of his statin roughly a year after the procedure as well.  He says that he also had a lot of GI side effects from the statin therapy pack as well.  He is previous anginal symptom was severe burning sensation in the chest along with her left shoulder pain.  His last office visit with his cardiologist was about 5 years ago.  He has not been taking any medication since then.  He usually does a lot of labor activity and that has been able to push a lawnmower for up to 3 hours without any issue.  He was also able to climb stairs or walk several blocks away from his house and back without issue either.  Everything changed in the past 3 to 40-month when he started having right lower abdominal pain.  Interestingly right lower quadrant pain is better with food.  RUQ  abdominal ultrasound suggested normal gallbladder, fatty liver disease and no evidence of bile duct obstruction. He eventually sought medical attention at [facility] on 11/25/2018.  Urinalysis was normal.  CT of abdomen and pelvis was concerning for early acute appendicitis.  No periappendiceal abscess or signs of frank perforation noted.  He has been seen by general surgery who is planning to start him on antibiotic therapy for management of appendicitis.  He may end up needing surgery as well.  Cardiology has been consulted for preoperative clearance.   Past Medical History:  Diagnosis Date  . CAD (coronary artery disease)     Past Surgical History:  Procedure Laterality Date  . CARDIAC SURGERY       Home Medications:  Prior to Admission medications   Not on File    Inpatient Medications: Scheduled Meds:  Continuous Infusions: . sodium chloride 125 mL/hr at 11/25/18 0545   PRN Meds:   Allergies:   No Known Allergies  Social History:   Social History   Socioeconomic History  . Marital status: Married    Spouse name: Not on file  . Number of children: Not on file  . Years of education: Not on file  . Highest education level: Not on file  Occupational History  . Not on file  Social Needs  . Financial resource strain: Not on file  . Food insecurity:    Worry: Not on file  Inability: Not on file  . Transportation needs:    Medical: Not on file    Non-medical: Not on file  Tobacco Use  . Smoking status: Never Smoker  . Smokeless tobacco: Never Used  Substance and Sexual Activity  . Alcohol use: Yes    Comment: socially  . Drug use: Never  . Sexual activity: Not on file  Lifestyle  . Physical activity:    Days per week: Not on file    Minutes per session: Not on file  . Stress: Not on file  Relationships  . Social connections:    Talks on phone: Not on file    Gets together: Not on file    Attends religious service: Not on file    Active member  of club or organization: Not on file    Attends meetings of clubs or organizations: Not on file    Relationship status: Not on file  . Intimate partner violence:    Fear of current or ex partner: Not on file    Emotionally abused: Not on file    Physically abused: Not on file    Forced sexual activity: Not on file  Other Topics Concern  . Not on file  Social History Narrative  . Not on file    Family History:    Family History  Problem Relation Age of Onset  . Hypertension Mother   . Hypertension Father   . Diabetes Mellitus II Father      ROS:  Please see the history of present illness.   All other ROS reviewed and negative.     Physical Exam/Data:   Vitals:   11/25/18 0630 11/25/18 0645 11/25/18 0700 11/25/18 0821  BP: 114/82 119/90 115/81 124/87  Pulse: 78 87 88 (!) 108  Resp:   16 16  Temp:      TempSrc:      SpO2: 92% 96% 95% 98%  Weight:      Height:       No intake or output data in the 24 hours ending 11/25/18 1118 Filed Weights   11/25/18 0519  Weight: 83.9 kg   Body mass index is 28.98 kg/m.  General:  Well nourished, well developed, in no acute distress HEENT: normal Lymph: no adenopathy Neck: no JVD Endocrine:  No thryomegaly Vascular: No carotid bruits; FA pulses 2+ bilaterally without bruits  Cardiac:  normal S1, S2; RRR; no murmur  Lungs:  clear to auscultation bilaterally, no wheezing, rhonchi or rales  Abd: soft, nontender, no hepatomegaly  Ext: no edema Musculoskeletal:  No deformities, BUE and BLE strength normal and equal Skin: warm and dry  Neuro:  CNs 2-12 intact, no focal abnormalities noted Psych:  Normal affect   EKG:  The EKG was personally reviewed and demonstrates:  EKG showed NSR without significant ST-T wave changes Telemetry:  Telemetry was personally reviewed and demonstrates:  Not on telemetry  Relevant CV Studies:  N/A  Laboratory Data:  Chemistry Recent Labs  Lab 11/25/18 0535  NA 136  K 3.9  CL 104  CO2  24  GLUCOSE 113*  BUN 13  CREATININE 1.04  CALCIUM 9.5  GFRNONAA >60  GFRAA >60  ANIONGAP 8    Recent Labs  Lab 11/25/18 0535  PROT 7.3  ALBUMIN 4.2  AST 28  ALT 34  ALKPHOS 67  BILITOT 0.8   Hematology Recent Labs  Lab 11/25/18 0535  WBC 15.2*  RBC 5.76  HGB 17.3*  HCT 49.3  MCV 85.6  MCH 30.0  MCHC 35.1  RDW 11.7  PLT 171   Cardiac EnzymesNo results for input(s): TROPONINI in the last 168 hours. No results for input(s): TROPIPOC in the last 168 hours.  BNPNo results for input(s): BNP, PROBNP in the last 168 hours.  DDimer No results for input(s): DDIMER in the last 168 hours.  Radiology/Studies:  Ct Abdomen Pelvis W Contrast  Result Date: 11/25/2018 CLINICAL DATA:  42 year old male with history of right lower quadrant abdominal pain this morning. No associated nausea, vomiting or diarrhea. EXAM: CT ABDOMEN AND PELVIS WITH CONTRAST TECHNIQUE: Multidetector CT imaging of the abdomen and pelvis was performed using the standard protocol following bolus administration of intravenous contrast. CONTRAST:  100 mL of Omnipaque 300. COMPARISON:  No priors. FINDINGS: Lower chest: Unremarkable. Hepatobiliary: No cystic or solid hepatic lesions. No intra or extrahepatic biliary ductal dilatation. Gallbladder is normal in appearance. Pancreas: No pancreatic mass. No pancreatic ductal dilatation. No pancreatic or peripancreatic fluid or inflammatory changes. Spleen: Unremarkable. Adrenals/Urinary Tract: Bilateral kidneys and adrenal glands are normal in appearance. No hydroureteronephrosis. Urinary bladder is normal in appearance. Stomach/Bowel: Normal appearance of the stomach. No pathologic dilatation of small bowel or colon. The appendix is dilated measuring 11 mm (coronal image 61 of series 6) with mucosal hyperenhancement and subtle surrounding inflammatory changes. No appendicoliths noted. No periappendiceal abscess or signs of frank perforation. Vascular/Lymphatic: No significant  atherosclerotic disease, aneurysm or dissection noted in the abdominal or pelvic vasculature. No lymphadenopathy noted in the abdomen or pelvis. Reproductive: Prostate gland and seminal vesicles are unremarkable in appearance. Other: No significant volume of ascites.  No pneumoperitoneum. Musculoskeletal: There are no aggressive appearing lytic or blastic lesions noted in the visualized portions of the skeleton. IMPRESSION: 1. Findings, as above, concerning for early acute appendicitis. No periappendiceal abscess or signs of frank perforation are noted at this time. Electronically Signed   By: Trudie Reed M.D.   On: 11/25/2018 08:13    Assessment and Plan:   1. Preoperative clearance prior to appendectomy: Surgery is currently planning to proceed with medical management with antibiotic, however may need surgery in the future.  Prior to the onset of the symptom, patient has been able to push a lawnmower to mow the lawn for up to 3 hours and has been able to climb stairs and walk around the blocks without any issue.  He clearly was able to complete at least 4 METS of activity.  His functional status is only limited at this time due to significant right lower quadrant abdominal pain. I will discuss with MD regarding preoperative clearance.  Recommend echocardiogram for the time being and I will discuss with cardiology attending to see if stress test is recommended.   - EKG showed NSR with ST-T wave changes  2. Appendicitis: Followed by general surgery  3. CAD: He reportedly had a metal stent placed in the back of the heart in 2012.  I do not have the record.  He was able to complete 1 year of aspirin and 6 months of Plavix.  He has not had any recurrent anginal symptoms recently.      For questions or updates, please contact [facility] Please consult [email or web address] for contact info under     Signed, Azalee Course, PA  11/25/2018 11:18 AM   History and all data above reviewed.  Patient  examined.  I agree with the findings as above.  The patient has a distant history of stenting.  We do not have any  details of this.  He says it was single-vessel disease and he had no disease elsewhere.  He took aspirin for a while but did not tolerate it.  He took Plavix for a while but did not tolerate it.  Over the years he stopped these.  At the time he had a heartburn-like sensation but has not had the symptoms since his stent in 2012.  He is a very active gentleman.  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  With this he denies any cardiovascular symptoms.   The patient denies any new symptoms such as chest discomfort, neck or arm discomfort. There has been no new shortness of breath, PND or orthopnea. There have been no reported palpitations, presyncope or syncope.  He is being considered for possible surgery for appendicitis and we are asked for preop clearance.  The patient exam reveals COR: RRR  ,  Lungs: Clear  ,  Abd: Positive bowel sounds, no rebound no guarding, Ext No edema  .  All available labs, radiology testing, previous records reviewed. Agree with documented assessment and plan.   PREOP: The patient has no high risk findings.  He is very active.  This is a low risk procedure.  No further cardiovascular testing is indicated according to ACC/AHA guidelines.  CAD: He does not tolerate Plavix or aspirin and so at this point I am not sure there is a significant advantage in trying to force this again.  He needs continued risk reduction.  Should get a lipid profile as a baseline at some point and have follow-up just to make sure he is at target with this.  I would encourage his statin but will defer to his primary cardiologist.  Rollene Rotunda  12:05 PM  11/25/2018

## 2018-11-25 NOTE — ED Notes (Signed)
General surgery bedside

## 2018-11-25 NOTE — ED Provider Notes (Signed)
[age]year-old male presents today with right lower quadrant pain that woke him up during the night he was seen and evaluated by Dr. Elesa MassedWard and signed out to me..  He has had some associated right flank pain and testicular pain over the past several months.  He had a ultrasound done of his right upper quadrant that showed fatty infiltrate.  Per his report, the right testicular ultrasound was normal.  This pain in his right lower quadrant associated with nausea but no vomiting. Review CT scan after completion.  We discussed with patient that if there is no abnormality seen he will be referred for further outpatient work-up.  8:20 AM CT results reviewed personally Radiology interpretation reviewed Noted concerns for early appendicitis General surgery consult placed Discussed with Mattie MarlinJessica Focht on call for general surgery.  Unasyn ordered. Discussed results and plan with patient and wife.     Margarita Grizzleay, Moncerrath Berhe, MD 11/25/18 [phone]

## 2018-11-25 NOTE — ED Notes (Signed)
Patient ambulatory to bathroom with steady gait at this time 

## 2018-11-25 NOTE — ED Notes (Signed)
Cardiology @ bedside.

## 2018-11-26 ENCOUNTER — Observation Stay (HOSPITAL_COMMUNITY): Payer: BLUE CROSS/BLUE SHIELD | Admitting: Certified Registered Nurse Anesthetist

## 2018-11-26 ENCOUNTER — Encounter (HOSPITAL_COMMUNITY)
Admission: EM | Disposition: A | Discharge status: Home / Self Care | Payer: Self-pay | Source: Home / Self Care | Attending: Emergency Medicine

## 2018-11-26 HISTORY — PX: LAPAROSCOPIC APPENDECTOMY: SHX408

## 2018-11-26 LAB — CBC WITH DIFFERENTIAL/PLATELET
Abs Immature Granulocytes: 0.02 10*3/uL (ref 0.00–0.07)
BASOS ABS: 0 10*3/uL (ref 0.0–0.1)
Basophils Relative: 1 %
Eosinophils Absolute: 0.4 10*3/uL (ref 0.0–0.5)
Eosinophils Relative: 5 %
HEMATOCRIT: 46.2 % (ref 39.0–52.0)
Hemoglobin: 16.1 g/dL (ref 13.0–17.0)
Immature Granulocytes: 0 %
Lymphocytes Relative: 42 %
Lymphs Abs: 3.4 10*3/uL (ref 0.7–4.0)
MCH: 30 pg (ref 26.0–34.0)
MCHC: 34.8 g/dL (ref 30.0–36.0)
MCV: 86 fL (ref 80.0–100.0)
Monocytes Absolute: 0.7 10*3/uL (ref 0.1–1.0)
Monocytes Relative: 9 %
Neutro Abs: 3.6 10*3/uL (ref 1.7–7.7)
Neutrophils Relative %: 43 %
Platelets: UNDETERMINED 10*3/uL (ref 150–400)
RBC: 5.37 MIL/uL (ref 4.22–5.81)
RDW: 11.9 % (ref 11.5–15.5)
WBC: 8 10*3/uL (ref 4.0–10.5)
nRBC: 0 % (ref 0.0–0.2)

## 2018-11-26 LAB — BASIC METABOLIC PANEL
Anion gap: 9 (ref 5–15)
BUN: 8 mg/dL (ref 6–20)
CO2: 26 mmol/L (ref 22–32)
CREATININE: 0.89 mg/dL (ref 0.61–1.24)
Calcium: 9.2 mg/dL (ref 8.9–10.3)
Chloride: 102 mmol/L (ref 98–111)
GFR calc Af Amer: >60 mL/min (ref >60)
GFR calc non Af Amer: >60 mL/min (ref >60)
Glucose, Bld: 85 mg/dL (ref 70–99)
Potassium: 3.7 mmol/L (ref 3.5–5.1)
Sodium: 137 mmol/L (ref 135–145)

## 2018-11-26 SURGERY — APPENDECTOMY, LAPAROSCOPIC
Anesthesia: General | Site: Abdomen

## 2018-11-26 MED ORDER — MIDAZOLAM HCL 2 MG/2ML IJ SOLN
INTRAMUSCULAR | Status: DC | PRN
  Administered 2018-11-26: 2 mg via INTRAVENOUS

## 2018-11-26 MED ORDER — LACTATED RINGERS IV SOLN: INTRAVENOUS | Status: DC

## 2018-11-26 MED ORDER — DIPHENHYDRAMINE HCL 50 MG/ML IJ SOLN
INTRAMUSCULAR | Status: DC | PRN
  Administered 2018-11-26: 12.5 mg via INTRAVENOUS

## 2018-11-26 MED ORDER — PROPOFOL 10 MG/ML IV BOLUS
INTRAVENOUS | Status: DC | PRN
  Administered 2018-11-26: 200 mg via INTRAVENOUS

## 2018-11-26 MED ORDER — PHENYLEPHRINE 40 MCG/ML (10ML) SYRINGE FOR IV PUSH (FOR BLOOD PRESSURE SUPPORT)
PREFILLED_SYRINGE | INTRAVENOUS | Status: AC
  Filled 2018-11-26: qty 10

## 2018-11-26 MED ORDER — ESMOLOL HCL 100 MG/10ML IV SOLN
INTRAVENOUS | Status: DC | PRN
  Administered 2018-11-26: 20 mg via INTRAVENOUS

## 2018-11-26 MED ORDER — ESMOLOL HCL 100 MG/10ML IV SOLN
INTRAVENOUS | Status: AC
  Filled 2018-11-26: qty 10

## 2018-11-26 MED ORDER — HYDROMORPHONE HCL 1 MG/ML IJ SOLN
0.2500 mg | INTRAMUSCULAR | Status: DC | PRN
  Administered 2018-11-26 (×2): 0.5 mg via INTRAVENOUS

## 2018-11-26 MED ORDER — ROCURONIUM BROMIDE 10 MG/ML (PF) SYRINGE
PREFILLED_SYRINGE | INTRAVENOUS | Status: DC | PRN
  Administered 2018-11-26: 30 mg via INTRAVENOUS
  Administered 2018-11-26: 20 mg via INTRAVENOUS

## 2018-11-26 MED ORDER — ONDANSETRON HCL 4 MG/2ML IJ SOLN
INTRAMUSCULAR | Status: DC | PRN
  Administered 2018-11-26: 4 mg via INTRAVENOUS

## 2018-11-26 MED ORDER — HYDROMORPHONE HCL 1 MG/ML IJ SOLN
INTRAMUSCULAR | Status: AC
  Administered 2018-11-26: 0.5 mg via INTRAVENOUS
  Filled 2018-11-26: qty 1

## 2018-11-26 MED ORDER — SUGAMMADEX SODIUM 200 MG/2ML IV SOLN
INTRAVENOUS | Status: DC | PRN
  Administered 2018-11-26: 200 mg via INTRAVENOUS

## 2018-11-26 MED ORDER — MIDAZOLAM HCL 2 MG/2ML IJ SOLN
INTRAMUSCULAR | Status: AC
  Filled 2018-11-26: qty 2

## 2018-11-26 MED ORDER — DEXAMETHASONE SODIUM PHOSPHATE 10 MG/ML IJ SOLN
INTRAMUSCULAR | Status: AC
  Filled 2018-11-26: qty 1

## 2018-11-26 MED ORDER — KETOROLAC TROMETHAMINE 30 MG/ML IJ SOLN
INTRAMUSCULAR | Status: DC | PRN
  Administered 2018-11-26: 30 mg via INTRAVENOUS

## 2018-11-26 MED ORDER — ONDANSETRON HCL 4 MG/2ML IJ SOLN
INTRAMUSCULAR | Status: AC
  Filled 2018-11-26: qty 2

## 2018-11-26 MED ORDER — LIDOCAINE 2% (20 MG/ML) 5 ML SYRINGE
INTRAMUSCULAR | Status: DC | PRN
  Administered 2018-11-26: 60 mg via INTRAVENOUS

## 2018-11-26 MED ORDER — SODIUM CHLORIDE 0.9 % IR SOLN
Status: DC | PRN
  Administered 2018-11-26: 1000 mL

## 2018-11-26 MED ORDER — LACTATED RINGERS IV SOLN
INTRAVENOUS | Status: DC | PRN
  Administered 2018-11-26 (×2): via INTRAVENOUS

## 2018-11-26 MED ORDER — PHENYLEPHRINE HCL 10 MG/ML IJ SOLN
INTRAMUSCULAR | Status: DC | PRN
  Administered 2018-11-26: 40 ug via INTRAVENOUS
  Administered 2018-11-26: 80 ug via INTRAVENOUS
  Administered 2018-11-26: 120 ug via INTRAVENOUS

## 2018-11-26 MED ORDER — HYDROMORPHONE HCL 1 MG/ML IJ SOLN: 0.5000 mg | INTRAMUSCULAR | Status: DC | PRN

## 2018-11-26 MED ORDER — FENTANYL CITRATE (PF) 250 MCG/5ML IJ SOLN
INTRAMUSCULAR | Status: DC | PRN
  Administered 2018-11-26 (×2): 50 ug via INTRAVENOUS
  Administered 2018-11-26: 100 ug via INTRAVENOUS
  Administered 2018-11-26: 50 ug via INTRAVENOUS

## 2018-11-26 MED ORDER — OXYCODONE HCL 5 MG PO TABS: 5.0000 mg | ORAL_TABLET | Freq: Once | ORAL | Status: DC | PRN

## 2018-11-26 MED ORDER — PROMETHAZINE HCL 25 MG/ML IJ SOLN: 6.2500 mg | INTRAMUSCULAR | Status: DC | PRN

## 2018-11-26 MED ORDER — 0.9 % SODIUM CHLORIDE (POUR BTL) OPTIME
TOPICAL | Status: DC | PRN
  Administered 2018-11-26: 1000 mL

## 2018-11-26 MED ORDER — BUPIVACAINE-EPINEPHRINE 0.25% -1:200000 IJ SOLN
INTRAMUSCULAR | Status: DC | PRN
  Administered 2018-11-26: 10 mL

## 2018-11-26 MED ORDER — FENTANYL CITRATE (PF) 250 MCG/5ML IJ SOLN
INTRAMUSCULAR | Status: AC
  Filled 2018-11-26: qty 5

## 2018-11-26 MED ORDER — PROPOFOL 10 MG/ML IV BOLUS
INTRAVENOUS | Status: AC
  Filled 2018-11-26: qty 20

## 2018-11-26 MED ORDER — OXYCODONE HCL 5 MG PO TABS: 5.0000 mg | ORAL_TABLET | ORAL | Status: DC | PRN

## 2018-11-26 MED ORDER — BUPIVACAINE-EPINEPHRINE (PF) 0.25% -1:200000 IJ SOLN
INTRAMUSCULAR | Status: AC
  Filled 2018-11-26: qty 30

## 2018-11-26 MED ORDER — DEXAMETHASONE SODIUM PHOSPHATE 10 MG/ML IJ SOLN
INTRAMUSCULAR | Status: DC | PRN
  Administered 2018-11-26: 10 mg via INTRAVENOUS

## 2018-11-26 MED ORDER — OXYCODONE HCL 5 MG/5ML PO SOLN: 5.0000 mg | Freq: Once | ORAL | Status: DC | PRN

## 2018-11-26 SURGICAL SUPPLY — 35 items
BLADE CLIPPER SURG (BLADE) ×3 IMPLANT
CANISTER SUCT 3000ML PPV (MISCELLANEOUS) ×3 IMPLANT
CHLORAPREP W/TINT 26ML (MISCELLANEOUS) ×3 IMPLANT
CLOSURE WOUND 1/2 X4 (GAUZE/BANDAGES/DRESSINGS) ×1
COVER SURGICAL LIGHT HANDLE (MISCELLANEOUS) ×3 IMPLANT
CUTTER FLEX LINEAR 45M (STAPLE) ×3 IMPLANT
DERMABOND ADVANCED (GAUZE/BANDAGES/DRESSINGS) ×2
DERMABOND ADVANCED .7 DNX12 (GAUZE/BANDAGES/DRESSINGS) ×1 IMPLANT
DRSG TEGADERM 2-3/8X2-3/4 SM (GAUZE/BANDAGES/DRESSINGS) ×9 IMPLANT
ELECT REM PT RETURN 9FT ADLT (ELECTROSURGICAL) ×3
ELECTRODE REM PT RTRN 9FT ADLT (ELECTROSURGICAL) ×1 IMPLANT
GLOVE BIOGEL PI IND STRL 8 (GLOVE) ×1 IMPLANT
GLOVE BIOGEL PI INDICATOR 8 (GLOVE) ×2
GLOVE ECLIPSE 7.5 STRL STRAW (GLOVE) ×3 IMPLANT
GOWN STRL REUS W/ TWL LRG LVL3 (GOWN DISPOSABLE) ×2 IMPLANT
GOWN STRL REUS W/TWL LRG LVL3 (GOWN DISPOSABLE) ×4
KIT BASIN OR (CUSTOM PROCEDURE TRAY) ×3 IMPLANT
KIT TURNOVER KIT B (KITS) ×3 IMPLANT
NS IRRIG 1000ML POUR BTL (IV SOLUTION) ×3 IMPLANT
PAD ARMBOARD 7.5X6 YLW CONV (MISCELLANEOUS) ×3 IMPLANT
POUCH RETRIEVAL ECOSAC 10 (ENDOMECHANICALS) ×1 IMPLANT
POUCH RETRIEVAL ECOSAC 10MM (ENDOMECHANICALS) ×2
RELOAD STAPLE TA45 3.5 REG BLU (ENDOMECHANICALS) ×3 IMPLANT
SET IRRIG TUBING LAPAROSCOPIC (IRRIGATION / IRRIGATOR) ×3 IMPLANT
SHEARS HARMONIC ACE PLUS 36CM (ENDOMECHANICALS) ×3 IMPLANT
SLEEVE ENDOPATH XCEL 5M (ENDOMECHANICALS) ×3 IMPLANT
SPECIMEN JAR SMALL (MISCELLANEOUS) ×3 IMPLANT
STRIP CLOSURE SKIN 1/2X4 (GAUZE/BANDAGES/DRESSINGS) ×2 IMPLANT
SUT MNCRL AB 4-0 PS2 18 (SUTURE) ×3 IMPLANT
TOWEL OR 17X24 6PK STRL BLUE (TOWEL DISPOSABLE) ×3 IMPLANT
TRAY LAPAROSCOPIC MC (CUSTOM PROCEDURE TRAY) ×3 IMPLANT
TROCAR XCEL BLUNT TIP 100MML (ENDOMECHANICALS) ×3 IMPLANT
TROCAR XCEL NON-BLD 5MMX100MML (ENDOMECHANICALS) ×3 IMPLANT
TUBING INSUFFLATION (TUBING) ×3 IMPLANT
WATER STERILE IRR 1000ML POUR (IV SOLUTION) ×3 IMPLANT

## 2018-11-26 NOTE — Anesthesia Preprocedure Evaluation (Addendum)
Anesthesia Evaluation  Patient identified by MRN, date of birth, ID band Patient awake    Reviewed: Allergy & Precautions, NPO status , Patient's Chart, lab work & pertinent test results  Airway Mallampati: II  TM Distance: >3 FB Neck ROM: Full    Dental no notable dental hx.    Pulmonary neg pulmonary ROS,    Pulmonary exam normal breath sounds clear to auscultation       Cardiovascular + CAD and + Cardiac Stents  Normal cardiovascular exam Rhythm:Regular Rate:Normal  ECG: NSR, rate 87  Pre-op eval per cardiology   Neuro/Psych negative neurological ROS  negative psych ROS   GI/Hepatic negative GI ROS, Neg liver ROS,   Endo/Other  negative endocrine ROS  Renal/GU negative Renal ROS     Musculoskeletal negative musculoskeletal ROS (+)   Abdominal   Peds  Hematology negative hematology ROS (+)   Anesthesia Other Findings Acute appendicitis  Reproductive/Obstetrics                            Anesthesia Physical Anesthesia Plan  ASA: II  Anesthesia Plan: General   Post-op Pain Management:    Induction: Intravenous  PONV Risk Score and Plan: 3 and Midazolam, Dexamethasone, Ondansetron and Treatment may vary due to age or medical condition  Airway Management Planned: Oral ETT  Additional Equipment:   Intra-op Plan:   Post-operative Plan: Extubation in OR  Informed Consent: I have reviewed the patients History and Physical, chart, labs and discussed the procedure including the risks, benefits and alternatives for the proposed anesthesia with the patient or authorized representative who has indicated his/her understanding and acceptance.   Dental advisory given  Plan Discussed with: CRNA  Anesthesia Plan Comments:         Anesthesia Quick Evaluation

## 2018-11-26 NOTE — Progress Notes (Signed)
[facility] Progress Note     Subjective: CC:  Pain improving. Again reports about 3 months of right-sided abdominal pain with some radiation to his scrotum. Pain became acutely worse 2 days ago and more localized to RLQ. Yesterday states he was unable to stand up straight or lay on his left side. Denies BM in 48h   Objective: Vital signs in last 24 hours: Temp:  [97.7 F (36.5 C)-98.3 F (36.8 C)] 97.9 F (36.6 C) (11/30 0752) Pulse Rate:  [72-93] 82 (11/30 0752) Resp:  [17-24] 18 (11/30 0752) BP: (107-144)/(68-100) 126/92 (11/30 0752) SpO2:  [95 %-100 %] 99 % (11/30 0752) Last BM Date: 11/25/18  Intake/Output from previous day: 11/29 0701 - 11/30 0700 In: 1085.9 [I.V.:985.9; IV Piggyback:100] Out: -  Intake/Output this shift: No intake/output data recorded.  PE: Gen:  Alert, NAD, pleasant Card:  Regular rate and rhythm, pedal pulses 2+ BL Pulm:  Normal effort, clear to auscultation bilaterally Abd: Soft, TTP RUQ, TTP RLQ with guarding, no peritonitis. No suprapubic or left-sided pain. +BS  Skin: warm and dry, no rashes  Psych: A&Ox3   Lab Results:  Recent Labs    11/25/18 0535 11/26/18 0423  WBC 15.2* 8.0  HGB 17.3* 16.1  HCT 49.3 46.2  PLT 171 PLATELET CLUMPS NOTED ON SMEAR, UNABLE TO ESTIMATE   BMET Recent Labs    11/25/18 0535 11/26/18 0423  NA 136 137  K 3.9 3.7  CL 104 102  CO2 24 26  GLUCOSE 113* 85  BUN 13 8  CREATININE 1.04 0.89  CALCIUM 9.5 9.2   PT/INR No results for input(s): LABPROT, INR in the last 72 hours. CMP     Component Value Date/Time   NA 137 11/26/2018 0423   K 3.7 11/26/2018 0423   CL 102 11/26/2018 0423   CO2 26 11/26/2018 0423   GLUCOSE 85 11/26/2018 0423   BUN 8 11/26/2018 0423   CREATININE 0.89 11/26/2018 0423   CALCIUM 9.2 11/26/2018 0423   PROT 7.3 11/25/2018 0535   ALBUMIN 4.2 11/25/2018 0535   AST 28 11/25/2018 0535   ALT 34 11/25/2018 0535   ALKPHOS 67 11/25/2018 0535   BILITOT 0.8 11/25/2018  0535   GFRNONAA >60 11/26/2018 0423   GFRAA >60 11/26/2018 0423   Lipase     Component Value Date/Time   LIPASE 35 10/05/2018 0934       Studies/Results: Ct Abdomen Pelvis W Contrast  Result Date: 11/25/2018 CLINICAL DATA:  42 year old male with history of right lower quadrant abdominal pain this morning. No associated nausea, vomiting or diarrhea. EXAM: CT ABDOMEN AND PELVIS WITH CONTRAST TECHNIQUE: Multidetector CT imaging of the abdomen and pelvis was performed using the standard protocol following bolus administration of intravenous contrast. CONTRAST:  100 mL of Omnipaque 300. COMPARISON:  No priors. FINDINGS: Lower chest: Unremarkable. Hepatobiliary: No cystic or solid hepatic lesions. No intra or extrahepatic biliary ductal dilatation. Gallbladder is normal in appearance. Pancreas: No pancreatic mass. No pancreatic ductal dilatation. No pancreatic or peripancreatic fluid or inflammatory changes. Spleen: Unremarkable. Adrenals/Urinary Tract: Bilateral kidneys and adrenal glands are normal in appearance. No hydroureteronephrosis. Urinary bladder is normal in appearance. Stomach/Bowel: Normal appearance of the stomach. No pathologic dilatation of small bowel or colon. The appendix is dilated measuring 11 mm (coronal image 61 of series 6) with mucosal hyperenhancement and subtle surrounding inflammatory changes. No appendicoliths noted. No periappendiceal abscess or signs of frank perforation. Vascular/Lymphatic: No significant atherosclerotic disease, aneurysm or dissection noted in the  abdominal or pelvic vasculature. No lymphadenopathy noted in the abdomen or pelvis. Reproductive: Prostate gland and seminal vesicles are unremarkable in appearance. Other: No significant volume of ascites.  No pneumoperitoneum. Musculoskeletal: There are no aggressive appearing lytic or blastic lesions noted in the visualized portions of the skeleton. IMPRESSION: 1. Findings, as above, concerning for early acute  appendicitis. No periappendiceal abscess or signs of frank perforation are noted at this time. Electronically Signed   By: Trudie Reed M.D.   On: 11/25/2018 08:13    Anti-infectives: Anti-infectives (From admission, onward)   Start     Dose/Rate Route Frequency Ordered Stop   11/25/18 1400  piperacillin-tazobactam (ZOSYN) IVPB 3.375 g     3.375 g 12.5 mL/hr over 240 Minutes Intravenous Every 8 hours 11/25/18 1343     11/25/18 1250  cefTRIAXone (ROCEPHIN) 2 g in sodium chloride 0.9 % 100 mL IVPB  Status:  Discontinued     2 g 200 mL/hr over 30 Minutes Intravenous Every 24 hours 11/25/18 1250 11/25/18 1343   11/25/18 1250  metroNIDAZOLE (FLAGYL) IVPB 500 mg  Status:  Discontinued     500 mg 100 mL/hr over 60 Minutes Intravenous Every 8 hours 11/25/18 1250 11/25/18 1343   11/25/18 0830  Ampicillin-Sulbactam (UNASYN) 3 g in sodium chloride 0.9 % 100 mL IVPB     3 g 200 mL/hr over 30 Minutes Intravenous  Once 11/25/18 0826 11/25/18 1042     Assessment/Plan HTN CAD s/p stent 2012  Chronic abdominal pain - 3 mos pain   Acute appendicitis  - CT abd/pelvis shows 11 cm dilated appendix with mucosal hyperenhancement without significant stranding or periappendiceal fluid - patient wanted to try non-operative management with IV abx - afebrile, VSS, WBC 8 from 15 - per cards no high risk cardiac findings, this is a low risk procedure and we could proceed with surgery if necessary. - I discussed surgical vs non-surgical management with the patient and he would like to discuss with his family and talk to the surgeon that sees him today. Treatment options would include lap appy today vs continuation of IV abx and trial of diet advancement.   - pt should get a colonoscopy in about 8 weeks once this acute process resolves.    LOS: 0 days      Hosie Spangle, [facility] Pager: [phone]       .ESG

## 2018-11-26 NOTE — Progress Notes (Signed)
RN verified the presence of a signed informed consent that matches stated procedure by patient. Verified armband matches patient's stated name and birth date. Verified NPO status and that all jewelry, contact, glasses, dentures, and partials had been removed (if applicable).  

## 2018-11-26 NOTE — Progress Notes (Signed)
Report called top Jake Schultz ,Charity fundraiserN in Pre-op

## 2018-11-26 NOTE — Progress Notes (Signed)
Transferred from PACU, alert,oriented x4. IVF in situ. Post-op sites clean.dry and intact

## 2018-11-26 NOTE — Op Note (Signed)
OPERATIVE REPORT  DATE OF OPERATION: 11/26/2018  PATIENT:  Jake Schultz  42 y.o. male  PRE-OPERATIVE DIAGNOSIS:  Acute Appendicitis  POST-OPERATIVE DIAGNOSIS:   Enlarged appendix with some vascular congestion  INDICATION(S) FOR OPERATION: Dilated appendix on CT scan with right lower quadrant pain.  FINDINGS: Possible early acute appendicitis, no evidence of perforation or exudate  PROCEDURE:  Procedure(s): APPENDECTOMY LAPAROSCOPIC  SURGEON:  Surgeon(s): Jake NormanWyatt, Virgie Chery, MD  ASSISTANT: None  ANESTHESIA:   general  COMPLICATIONS: None  EBL: 5 ml  BLOOD ADMINISTERED: none  DRAINS: none   SPECIMEN:  Source of Specimen:  Appendix  COUNTS CORRECT:  YES  PROCEDURE DETAILS: The patient was taken to the operating room and placed on table in the supine position.  After an adequate general endotracheal anesthetic was administered, he was prepped and draped in usual sterile manner exposing the entire abdomen.  A proper timeout was performed identifying the patient and procedure to be performed.  We started with a supraumbilical midline incision down to the fascia.  We incised the fascia then subsequently bluntly dissected down to the peritoneal cavity.  A pursestring suture of 0 Vicryl was passed around the fascial opening which secured in place a Hassan cannula which was subsequently passed.  We insufflated peritoneal gas of carbon dioxide up to a maximal intra-abdominal pressure of 15 mmHg.  Right upper quadrant 5 mm cannula and then the left lower quadrant 5 mm cannula passed under direct vision.  The patient was placed in Trendelenburg position and the left side was tilted down.  We could easily visualize the appendix and the wall of the cecum and appendix was tethered slightly to the lateral wall of the pelvis which we took down with a harmonic scalpel.  We were able to mobilize and take down the mesoappendix using the harmonic scalpel and subsequently we were able to come across  the base of the appendix at the cecum using blue cartridge Endo GIA.  This completely detached appendix which was subsequently removed from the peritoneal cavity using an Ecopouch bag.  Once this was done we irrigated the right lower quadrant with saline solution then placed the patient back in the neutral position.  We used a pursestring suture at the supraumbilical site to close out the fascia at that site.  We aspirated all fluid and gas from the peritoneal cavity and removed all cannulas.  All needle counts, sponge counts, and instrument counts were correct.  We injected 0.25% Marcaine with epinephrine at all sites.  We closed the supraumbilical skin site using a running subcuticular stitch of 4-0 Monocryl.  Dermabond, Steri-Strips, and Tegaderms were used to complete all dressings.  PATIENT DISPOSITION:  PACU - hemodynamically stable.   Jake Schultz 11/30/20193:46 PM

## 2018-11-26 NOTE — Progress Notes (Signed)
Patient ambulated the hall around 400 ft with his family.  Patient tolerated the wall very well.  He refused any pain medications at this time.

## 2018-11-26 NOTE — Transfer of Care (Signed)
Immediate Anesthesia Transfer of Care Note  Patient: Jake Schultz  Procedure(s) Performed: APPENDECTOMY LAPAROSCOPIC (N/A Abdomen)  Patient Location: PACU  Anesthesia Type:General  Level of Consciousness: awake, alert  and oriented  Airway & Oxygen Therapy: Patient Spontanous Breathing  Post-op Assessment: Report given to RN and Post -op Vital signs reviewed and stable  Post vital signs: Reviewed and stable  Last Vitals:  Vitals Value Taken Time  BP 117/78 11/26/2018  3:54 PM  Temp    Pulse 97 11/26/2018  3:58 PM  Resp 13 11/26/2018  3:58 PM  SpO2 98 % 11/26/2018  3:58 PM  Vitals shown include unvalidated device data.  Last Pain:  Vitals:   11/26/18 1359  TempSrc:   PainSc: 1          Complications: No apparent anesthesia complications

## 2018-11-26 NOTE — Anesthesia Procedure Notes (Signed)
Procedure Name: Intubation Date/Time: 11/26/2018 2:51 PM Performed by: Clearnce Sorrel, CRNA Pre-anesthesia Checklist: Patient identified, Emergency Drugs available, Suction available, Patient being monitored and Timeout performed Patient Re-evaluated:Patient Re-evaluated prior to induction Oxygen Delivery Method: Circle system utilized Preoxygenation: Pre-oxygenation with 100% oxygen Induction Type: IV induction Ventilation: Mask ventilation without difficulty and Oral airway inserted - appropriate to patient size Laryngoscope Size: Mac and 3 Grade View: Grade I Tube type: Oral Tube size: 7.5 mm Number of attempts: 1 Airway Equipment and Method: Stylet Placement Confirmation: ETT inserted through vocal cords under direct vision,  CO2 detector and breath sounds checked- equal and bilateral Secured at: 23 cm Tube secured with: Tape Dental Injury: Teeth and Oropharynx as per pre-operative assessment

## 2018-11-26 NOTE — Anesthesia Postprocedure Evaluation (Signed)
Anesthesia Post Note  Patient: Jake BordersMiguel Schultz  Procedure(s) Performed: APPENDECTOMY LAPAROSCOPIC (N/A Abdomen)     Patient location during evaluation: PACU Anesthesia Type: General Level of consciousness: awake and alert Pain management: pain level controlled Vital Signs Assessment: post-procedure vital signs reviewed and stable Respiratory status: spontaneous breathing, nonlabored ventilation, respiratory function stable and patient connected to nasal cannula oxygen Cardiovascular status: blood pressure returned to baseline and stable Postop Assessment: no apparent nausea or vomiting Anesthetic complications: no    Last Vitals:  Vitals:   11/26/18 1645 11/26/18 1722  BP:  136/84  Pulse: 76 86  Resp: 12 16  Temp: 36.9 C 36.6 C  SpO2: 100% 100%    Last Pain:  Vitals:   11/26/18 1726  TempSrc:   PainSc: 3                  Jaysten Essner P Jenavee Laguardia

## 2018-11-27 ENCOUNTER — Encounter (HOSPITAL_COMMUNITY): Payer: Self-pay | Admitting: General Surgery

## 2018-11-27 MED ORDER — OXYCODONE HCL 5 MG PO TABS: 5.0000 mg | ORAL_TABLET | ORAL | 0 refills | Status: DC | PRN

## 2018-11-27 NOTE — Discharge Instructions (Signed)
Laparoscopic Appendectomy, Adult A laparoscopic appendectomy is a surgery to take out the appendix. The appendix is a finger-like structure that is attached to the large intestine. In this surgery, the appendix is removed through two or three small cuts (incisions) with the help of a thin, lighted tube that has a tiny camera on the end (laparoscope). A laparoscopic appendectomy may be done to prevent an inflamed appendix from bursting (rupturing). Or, it may be done to treat the infection from an appendix that has already ruptured. It is usually done immediately after inflammation of the appendix (appendicitis) is diagnosed. Tell a health care provider about:  Any allergies you have.  All medicines you are taking, including vitamins, herbs, eye drops, creams, and over-the-counter medicines.  Any problems you or family members have had with anesthetic medicines.  Any blood disorders you have.  Any surgeries you have had.  Any medical conditions you have.  Whether you are pregnant or may be pregnant. What are the risks? Generally, this is a safe procedure. However, problems may occur, including:  Infection.  Bleeding.  Allergic reactions to medicines.  Damage to other structures or organs.  The formation of collections of pus (abscesses).  Long-lasting pain or scarring at the incision sites or inside the abdomen.  Blood clots in the legs.  What happens before the procedure?  Follow instructions from your health care provider about eating or drinking restrictions.  Ask your health care provider about: ? Changing or stopping your regular medicines. This is especially important if you are taking diabetes medicines or blood thinners. ? Taking medicines such as aspirin and ibuprofen. These medicines can thin your blood. Do not take these medicines before your procedure if your health care provider instructs you not to.  Ask your health care provider how your surgical site will be  marked or identified.  You may be given antibiotic medicine to help prevent infection or to treat existing inflammation or infection.  If you will be going home on the same day as your surgery, plan to have someone with you for 24 hours. What happens during the procedure?  To reduce your risk of infection: ? Your health care team will wash or sanitize their hands. ? Your skin will be washed with soap.  An IV tube will be inserted into one of your veins. You will receive medicine and fluids through this tube.  You will be given one or more of the following: ? A medicine to help you relax (sedative). ? A medicine to numb the area (local anesthetic). ? A medicine to make you fall asleep (general anesthetic). ? A medicine that is injected into your spine to numb the area below and slightly above the injection site (spinal anesthetic). ? A medicine that is injected into an area of your body to numb everything below the injection site (regional anesthetic).  A thin, flexible tube (catheter) may be put into your bladder to drain urine.  A tube may be passed through your nose and into your stomach (NG tube, or nasogastric tube) to drain any stomach contents.  Your surgeon will make two or three small incisions near your belly button (navel).  Air-like gas will be used to fill your abdomen. The gas will make your abdomen expand. This helps the surgeon to see clearly and gives him or her more room to work.  A laparoscope will be passed through one of the incisions.  Other long, thin surgical instruments will be passed through the other   incisions.  The appendix will be located and removed through one of the incisions.  The abdomen may be washed out to remove bacteria.  The incisions will be closed with stitches (sutures), staples, or adhesive strips.  A bandage (dressing) may be used to cover the incisions.  If a tube was inserted into your bladder or stomach, it will be removed. What  happens after the procedure?  Your blood pressure, heart rate, breathing rate, and blood oxygen level will be monitored often until the medicines you were given have worn off.  You will be given pain medicine as needed to keep you comfortable.  If your appendix did not rupture, you may be able to go home the same day as your surgery.  Do not drive for 24 hours if you received a sedative.  If your appendix ruptured: ? You will get antibiotic medicine through an IV tube. ? You may be sent home with a temporary drain. This information is not intended to replace advice given to you by your health care provider. Make sure you discuss any questions you have with your health care provider. Document Released: 07/28/2004 Document Revised: 05/21/2016 Document Reviewed: 06/03/2015 Elsevier Interactive Patient Education  2018 [employer] Inc[employer]  

## 2018-11-27 NOTE — Progress Notes (Signed)
Pt discharged home with wife in stable condition after going over discharge teaching with no concerns voiced. Discharge paperwork given before leaving unit

## 2018-11-27 NOTE — Discharge Summary (Signed)
Physician Discharge Summary  Patient ID: Jake BordersMiguel Schultz MRN: [id number] DOB/AGE: [birth year] 42 y.o.  [facility]  Admit date: 11/25/2018 Discharge date: 11/27/2018  Admission Diagnoses:  Possible appendicitis  Discharge Diagnoses:  Possible appendicitis path pending  Active Problems:   Appendicitis   Surgery:  Lap appendectomy  Discharged Condition: good  Hospital Course:   Was admitted and seen by Dr. Cliffton AstersWhite.  Patient initially wanted to avoid lap appy.  He had a change of heart and Dr. Lindie SpruceWyatt removed his appendix.  He did well and was ready to go home.  Advised to seek colonoscopy and discuss with Dr. Cliffton AstersWhite at followup.   Consults: none  Significant Diagnostic Studies: CT scan    Discharge Exam: Blood pressure 122/88, pulse 76, temperature 97.7 F (36.5 C), temperature source Oral, resp. rate 16, height 5\' 7"  (1.702 m), weight 83.9 kg, SpO2 99 %. Incisions are bland  Disposition: Discharge disposition: 01-Home or Self Care       Discharge Instructions    Call MD for:  persistant nausea and vomiting   Complete by:  As directed    Call MD for:  severe uncontrolled pain   Complete by:  As directed    Call MD for:  temperature >100.4   Complete by:  As directed    Diet - low sodium heart healthy   Complete by:  As directed    Discharge instructions   Complete by:  As directed    You may shower when you get home. Discuss followup colonoscopy with Dr. Angelena Formhris White   Increase activity slowly   Complete by:  As directed      Allergies as of 11/27/2018   No Known Allergies     Medication List    TAKE these medications   oxyCODONE 5 MG immediate release tablet Commonly known as:  Oxy IR/ROXICODONE Take 1 tablet (5 mg total) by mouth every 4 (four) hours as needed for severe pain.      Follow-up Information    Andria MeuseWhite, Christopher M, MD Follow up in 3 week(s).   Specialty:  General Surgery Contact information: [address]  [place] [id number] [phone]           Signed: Valarie MerinoMatthew B Katiejo Gilroy 11/27/2018, 8:11 AM

## 2018-11-29 ENCOUNTER — Emergency Department (HOSPITAL_COMMUNITY)
Admission: EM | Admit: 2018-11-29 | Discharge: 2018-11-29 | Disposition: A | Discharge status: Home / Self Care | Payer: BLUE CROSS/BLUE SHIELD | Attending: Emergency Medicine | Admitting: Emergency Medicine

## 2018-11-29 ENCOUNTER — Emergency Department (HOSPITAL_COMMUNITY): Payer: BLUE CROSS/BLUE SHIELD

## 2018-11-29 ENCOUNTER — Other Ambulatory Visit: Payer: Self-pay

## 2018-11-29 DIAGNOSIS — I2510 Atherosclerotic heart disease of native coronary artery without angina pectoris: Secondary | ICD-10-CM | POA: Diagnosis not present

## 2018-11-29 DIAGNOSIS — R079 Chest pain, unspecified: Secondary | ICD-10-CM | POA: Diagnosis not present

## 2018-11-29 LAB — CBC
HEMATOCRIT: 46.1 % (ref 39.0–52.0)
Hemoglobin: 16.2 g/dL (ref 13.0–17.0)
MCH: 30.2 pg (ref 26.0–34.0)
MCHC: 35.1 g/dL (ref 30.0–36.0)
MCV: 85.8 fL (ref 80.0–100.0)
NRBC: 0 % (ref 0.0–0.2)
Platelets: 152 10*3/uL (ref 150–400)
RBC: 5.37 MIL/uL (ref 4.22–5.81)
RDW: 11.8 % (ref 11.5–15.5)
WBC: 8.6 10*3/uL (ref 4.0–10.5)

## 2018-11-29 LAB — I-STAT TROPONIN, ED
TROPONIN I, POC: 0 ng/mL (ref 0.00–0.08)
Troponin i, poc: 0 ng/mL (ref 0.00–0.08)

## 2018-11-29 LAB — BASIC METABOLIC PANEL
Anion gap: 9 (ref 5–15)
BUN: 9 mg/dL (ref 6–20)
CO2: 25 mmol/L (ref 22–32)
Calcium: 9.4 mg/dL (ref 8.9–10.3)
Chloride: 102 mmol/L (ref 98–111)
Creatinine, Ser: 0.91 mg/dL (ref 0.61–1.24)
GFR calc Af Amer: >60 mL/min (ref >60)
GFR calc non Af Amer: >60 mL/min (ref >60)
Glucose, Bld: 118 mg/dL — ABNORMAL HIGH (ref 70–99)
Potassium: 3.9 mmol/L (ref 3.5–5.1)
Sodium: 136 mmol/L (ref 135–145)

## 2018-11-29 LAB — D-DIMER, QUANTITATIVE (NOT AT ARMC): D DIMER QUANT: 0.31 ug{FEU}/mL (ref 0.00–0.50)

## 2018-11-29 MED ORDER — SODIUM CHLORIDE 0.9 % IV BOLUS: 1000.0000 mL | Freq: Once | INTRAVENOUS | Status: DC

## 2018-11-29 NOTE — ED Triage Notes (Signed)
3 days post op from appendix-pt states this morning he began to have shortness of breath-  And 2 hours ago he started having left sided chest pain into left arm, with sweating. Hx of 1 stent.

## 2018-11-29 NOTE — ED Notes (Signed)
IV attempted x2 without success.

## 2018-11-29 NOTE — ED Notes (Signed)
Iv refused

## 2018-11-29 NOTE — ED Notes (Signed)
DCinsrtructions discussed with pt and wife. Understanding voiced. Unable to sign due to computer. Printed instructions given. Homestable via wc.

## 2018-11-29 NOTE — Discharge Instructions (Signed)
You can take Tylenol or Ibuprofen as directed for pain. You can alternate Tylenol and Ibuprofen every 4 hours. If you take Tylenol at 1pm, then you can take Ibuprofen at 5pm. Then you can take Tylenol again at 9pm.   Follow-up with your surgeon as directed.  Follow up with cardiology.  Return to emergency department for any chest pain, difficulty breathing, fevers, vomiting, difficulty moving your arms or legs, difficulty speaking, vision changes or any other worsening or concerning symptoms.

## 2018-11-29 NOTE — ED Provider Notes (Signed)
[facility] Note   CSN: [id number] Arrival date & time: 11/29/18  1353     History   Chief Complaint Chief Complaint  Patient presents with  . Chest Pain    HPI Jake Schultz is a 42 y.o. male with PMH/o CAD who presents for evaluation of cough, CP, and SOB that began at approximately 11am.  Patient reports that when he got up this morning, he felt "a little woozy and off."  He states it is not uncommon for him to wake up like that and thought that this is his normal symptoms.  He states that at about 11:00, he started developing a cough and then reports right afterwards, he had an episode where he had difficulty breathing.  Following that, he had a pressure sensation to the left chest that radiated to his left arm.  He states he did get sweaty in his left arm with the symptoms happened.  He states he did not get nausea or vomiting with this pain.  Patient states he called his general surgeon in front of the emergency department for further evaluation.  He is 4 days status post appendectomy and was discharged on the hospital on 11/27/18.  He states his abdomen has been sore but he has not had any nausea or vomiting and has not noted any fever at home.  He states he has been able to eat minimal amounts of food.  He does report difficulty moving around secondary to the abdominal tenderness.  On ED arrival, he states that the chest pain, shortness of breath have resolved. He states that the shortness of breath was an episode and has not been continuous while being at home.  He has been able to move with exertion and continue his normal activity without any difficulty.  He states his legs feel wobbly when walking.  Patient does not currently smoke.  Patient denies any vision changes, difficulty speaking, difficulty moving or feeling his arms or legs.  The history is provided by the patient.    Past Medical History:  Diagnosis Date  . CAD (coronary  artery disease)     Patient Active Problem List   Diagnosis Date Noted  . Appendicitis 11/25/2018    Past Surgical History:  Procedure Laterality Date  . LAPAROSCOPIC APPENDECTOMY N/A 11/26/2018   Procedure: APPENDECTOMY LAPAROSCOPIC;  Surgeon: Jimmye NormanWyatt, James, MD;  Location: [facility] OR;  Service: General;  Laterality: N/A;        Home Medications    Prior to Admission medications   Medication Sig Start Date End Date Taking? Authorizing Provider  acetaminophen (TYLENOL) 500 MG tablet Take 1,000 mg by mouth every 6 (six) hours as needed for moderate pain.   Yes [provider]  oxyCODONE (OXY IR/ROXICODONE) 5 MG immediate release tablet Take 1 tablet (5 mg total) by mouth every 4 (four) hours as needed for severe pain. 11/27/18   Luretha MurphyMartin, Matthew, MD    Family History Family History  Problem Relation Age of Onset  . Hypertension Mother   . Hypertension Father   . Diabetes Mellitus II Father     Social History Social History   Tobacco Use  . Smoking status: Never Smoker  . Smokeless tobacco: Never Used  Substance Use Topics  . Alcohol use: Yes    Comment: socially  . Drug use: Never     Allergies   Aspirin and Plavix [clopidogrel bisulfate]   Review of Systems Review of Systems  Constitutional: Negative for  fever.  Eyes: Negative for visual disturbance.  Respiratory: Positive for shortness of breath (resolved). Negative for cough.   Cardiovascular: Positive for chest pain (resolved).  Gastrointestinal: Negative for abdominal pain, nausea and vomiting.  Genitourinary: Negative for dysuria and hematuria.  Neurological: Positive for light-headedness and numbness. Negative for speech difficulty, weakness and headaches.  All other systems reviewed and are negative.    Physical Exam Updated Vital Signs BP (!) 124/94 (BP Location: Right Arm)   Pulse 93   Temp 98.8 F (37.1 C)   Resp 14   SpO2 96%   Physical Exam  Constitutional: He is oriented to person,  place, and time. He appears well-developed and well-nourished.  HENT:  Head: Normocephalic and atraumatic.  Mouth/Throat: Oropharynx is clear and moist and mucous membranes are normal.  Eyes: Pupils are equal, round, and reactive to light. Conjunctivae, EOM and lids are normal.  EOMs intact without any difficulty  Neck: Full passive range of motion without pain.  Cardiovascular: Normal rate, regular rhythm, normal heart sounds and normal pulses. Exam reveals no gallop and no friction rub.  No murmur heard. Pulses:      Radial pulses are 2+ on the right side, and 2+ on the left side.       Dorsalis pedis pulses are 2+ on the right side, and 2+ on the left side.  Pulmonary/Chest: Effort normal and breath sounds normal.  Lungs clear to auscultation bilaterally.  Symmetric chest rise.  No wheezing, rales, rhonchi.  Abdominal: Soft. Normal appearance. There is generalized tenderness. There is no rigidity and no guarding.  Abdomen is soft, nondistended.  Mild generalized tenderness with no focal point. No rigidity, guarding.   Musculoskeletal: Normal range of motion.  BLE are symmetric in appearance without any overlying warmth, erythema, or edema.   Neurological: He is alert and oriented to person, place, and time.  Cranial nerves III-XII intact Follows commands, Moves all extremities  5/5 strength to BUE and BLE  Sensation intact throughout all major nerve distributions Normal coordation No pronator drift. No gait abnormalities  No slurred speech. No facial droop.  Normal gait  Skin: Skin is warm and dry. Capillary refill takes less than 2 seconds.  Healing incision sites noted on abdomen without any signs of surrounding warmth, erythema, edema.  No purulent drainage.  Psychiatric: He has a normal mood and affect. His speech is normal.  Nursing note and vitals reviewed.    ED Treatments / Results  Labs (all labs ordered are listed, but only abnormal results are displayed) Labs  Reviewed  BASIC METABOLIC PANEL - Abnormal; Notable for the following components:      Result Value   Glucose, Bld 118 (*)    All other components within normal limits  CBC  D-DIMER, QUANTITATIVE (NOT AT [facility])  I-STAT TROPONIN, ED  I-STAT TROPONIN, ED    EKG EKG Interpretation  Date/Time:  Tuesday November 29 2018 13:58:25 EST Ventricular Rate:  90 PR Interval:  130 QRS Duration: 104 QT Interval:  354 QTC Calculation: 433 R Axis:   85 Text Interpretation:  Normal sinus rhythm Incomplete right bundle branch block Nonspecific T wave abnormality Abnormal ECG When compared to prior, no significant changes seen.  No STEMI Confirmed by Theda Belfast ([id number]) on 11/29/2018 2:26:43 PM   Radiology Dg Chest 2 View  Result Date: 11/29/2018 CLINICAL DATA:  Chest pain, shortness of breath. EXAM: CHEST - 2 VIEW COMPARISON:  Radiographs of November 29, 2017. FINDINGS: The heart size and mediastinal  contours are within normal limits. Both lungs are clear. No pneumothorax or pleural effusion is noted. The visualized skeletal structures are unremarkable. IMPRESSION: No active cardiopulmonary disease. Electronically Signed   By: Lupita Raider, M.D.   On: 11/29/2018 15:21    Procedures Procedures (including critical care time)  Medications Ordered in ED Medications - No data to display   Initial Impression / Assessment and Plan / ED Course  I have reviewed the triage vital signs and the nursing notes.  Pertinent labs & imaging results that were available during my care of the patient were reviewed by me and considered in my medical decision making (see chart for details).     42 y.o. M who is 4 days s/p appendectomy for evaluation of an episode of CP, SOB, lightheadedness that occurred at 11am. Symptoms have resolved, though he states he feels slightly woozy when walking. Patient is afebrile, non-toxic appearing, sitting comfortably on examination table. Vital signs reviewed and stable.  No  neuro deficits on exam.  Abdomen exam is benign.  He has some generalized tenderness no focal point.  Suspect this is likely from recent surgery.  Incision sites appear to be healing nicely with no surrounding warmth, erythema, purulent drainage.  No concern for infectious etiology.  Doubt ACS etiology given his presentation.  Additionally, he had a one episode of shortness of breath but given his recent postsurgical status, will plan to obtain d-dimer.  Additionally, patient with no neuro deficits.  He is able to ambulate without any signs of ataxia.  History/physical exam not concerning for CVA, vertigo.  History/physical exam not concerning for aortic dissection.  Check labs, chest x-ray, EKG.  Additionally, do not suspect intra-abdominal pathology.  Patient with no rigidity, guarding of the abdomen.  No concern for surgical abdomen.  Indication for CT abdomen pelvis at this time.  Initial troponin negative.  CBC without any significant leukocytosis or anemia.  BMP is unremarkable chest x-ray negative for any acute infectious etiology.  D-dimer is negative.  Discussed results with patient.  He is able to ambulate in the department without any difficulty.  Given his history of heart sent, will plan for delta troponin though low suspicion for ACS etiology. Patient has a heart score of 2.   Repeat troponin negative.  Vital signs stable. Discussed patient with Dr. Donnald Garre who independently evaluated patient. Agrees with plan.   Updated patient on plan.  He is comfortable going home.  Instructed to follow-up with cardiology and his surgeon as directed. Patient had ample opportunity for questions and discussion. All patient's questions were answered with full understanding. Strict return precautions discussed. Patient expresses understanding and agreement to plan.   Final Clinical Impressions(s) / ED Diagnoses   Final diagnoses:  Nonspecific chest pain    ED Discharge Orders    None       Rosana Hoes 11/29/18 2355    Arby Barrette, MD 12/07/18 1400

## 2018-11-29 NOTE — ED Notes (Signed)
Patient transported to X-ray 

## 2018-12-06 ENCOUNTER — Encounter: Payer: Self-pay | Admitting: Family Medicine

## 2018-12-06 ENCOUNTER — Ambulatory Visit: Payer: BLUE CROSS/BLUE SHIELD | Admitting: Family Medicine

## 2018-12-06 VITALS — BP 128/80 | HR 87 | Ht 67.0 in | Wt 196.0 lb

## 2018-12-06 DIAGNOSIS — I2510 Atherosclerotic heart disease of native coronary artery without angina pectoris: Secondary | ICD-10-CM

## 2018-12-06 DIAGNOSIS — R202 Paresthesia of skin: Secondary | ICD-10-CM

## 2018-12-06 DIAGNOSIS — I2583 Coronary atherosclerosis due to lipid rich plaque: Secondary | ICD-10-CM

## 2018-12-06 DIAGNOSIS — K219 Gastro-esophageal reflux disease without esophagitis: Secondary | ICD-10-CM

## 2018-12-06 DIAGNOSIS — R079 Chest pain, unspecified: Secondary | ICD-10-CM

## 2018-12-06 MED ORDER — OMEPRAZOLE 40 MG PO CPDR: 40.0000 mg | DELAYED_RELEASE_CAPSULE | Freq: Every day | ORAL | 3 refills | Status: DC

## 2018-12-06 NOTE — Progress Notes (Addendum)
Established Patient Office Visit  Subjective:  Patient ID: Jake Schultz, male    DOB: [birth year]  Age: 42 y.o. MRN: [id number]  CC:  Chief Complaint  Patient presents with  . Establish Care    HPI Jake Schultz presents for establishment of care. He recently had an appendectomy.  Patient is status post appendectomy this past week.  He has been recovering well.  He is having no significant abdominal pain nausea or vomiting and has been moving his bowels.  Since surgery he has developed paresthesias in his lower legs.  They resolve when he is up and moving around and return when he is recumbent.  Paresthesias seem to be permanent in his left heel.  He has no issues with back pain, bowel or bladder dysfunction, saddle paresthesias.  He has no history of diabetes.  Earlier today he felt some left anterior chest pain that occurred at rest..  He has no exertional chest pain nausea or vomiting or diaphoresis.  He has no history of hypertension.  Of significance he was diagnosed with coronary artery disease back in 2011.  He received a stent in 1 of his coronary arteries.  He was lost to follow-up.  He developed developed a rash with Plavix he says.  He was unable to tolerate Crestor or Lipitor.  He does have a history of reflux.  He is not taking anything for that at this time.  He has had some stomach upset with aspirin.  He says that he developed melena with aspirin use.  Past Medical History:  Diagnosis Date  . CAD (coronary artery disease)     Past Surgical History:  Procedure Laterality Date  . heart stent    . LAPAROSCOPIC APPENDECTOMY N/A 11/26/2018   Procedure: APPENDECTOMY LAPAROSCOPIC;  Surgeon: Jimmye Norman, MD;  Location: [facility] OR;  Service: General;  Laterality: N/A;    Family History  Problem Relation Age of Onset  . Hypertension Mother   . Hypertension Father   . Diabetes Mellitus II Father     Social History   Socioeconomic History  . Marital status: Married    Spouse  name: Not on file  . Number of children: Not on file  . Years of education: Not on file  . Highest education level: Not on file  Occupational History  . Not on file  Social Needs  . Financial resource strain: Not on file  . Food insecurity:    Worry: Not on file    Inability: Not on file  . Transportation needs:    Medical: Not on file    Non-medical: Not on file  Tobacco Use  . Smoking status: Never Smoker  . Smokeless tobacco: Never Used  Substance and Sexual Activity  . Alcohol use: Yes    Comment: socially  . Drug use: Never  . Sexual activity: Not on file  Lifestyle  . Physical activity:    Days per week: Not on file    Minutes per session: Not on file  . Stress: Not on file  Relationships  . Social connections:    Talks on phone: Not on file    Gets together: Not on file    Attends religious service: Not on file    Active member of club or organization: Not on file    Attends meetings of clubs or organizations: Not on file    Relationship status: Not on file  . Intimate partner violence:    Fear of current or ex partner: Not  on file    Emotionally abused: Not on file    Physically abused: Not on file    Forced sexual activity: Not on file  Other Topics Concern  . Not on file  Social History Narrative  . Not on file    Outpatient Medications Prior to Visit  Medication Sig Dispense Refill  . acetaminophen (TYLENOL) 500 MG tablet Take 1,000 mg by mouth every 6 (six) hours as needed for moderate pain.    Marland Kitchen oxyCODONE (OXY IR/ROXICODONE) 5 MG immediate release tablet Take 1 tablet (5 mg total) by mouth every 4 (four) hours as needed for severe pain. 30 tablet 0   No facility-administered medications prior to visit.     Allergies  Allergen Reactions  . Aspirin Other (See Comments)    Causes stomach issues  . Plavix [Clopidogrel Bisulfate] Other (See Comments)    Causes stomach problems   Depression screen [facility] 2/9 12/06/2018  Decreased Interest 0  Down,  Depressed, Hopeless 0  PHQ - 2 Score 0  Altered sleeping 1  Tired, decreased energy 2  Change in appetite 0  Feeling bad or failure about yourself  0  Trouble concentrating 0  Moving slowly or fidgety/restless 0  Suicidal thoughts 0  PHQ-9 Score 3    ROS Review of Systems  Constitutional: Negative for chills, diaphoresis, fatigue, fever and unexpected weight change.  HENT: Negative.   Eyes: Negative for photophobia and visual disturbance.  Respiratory: Negative for chest tightness, shortness of breath and wheezing.   Cardiovascular: Positive for chest pain. Negative for palpitations and leg swelling.  Gastrointestinal: Negative for diarrhea, nausea, rectal pain and vomiting.  Musculoskeletal: Positive for myalgias.  Skin: Negative for pallor.  Allergic/Immunologic: Negative for immunocompromised state.  Neurological: Positive for numbness. Negative for weakness and headaches.  Hematological: Does not bruise/bleed easily.  Psychiatric/Behavioral: Negative.       Objective:    Physical Exam  Constitutional: He is oriented to person, place, and time. He appears well-developed and well-nourished. No distress.  HENT:  Head: Normocephalic and atraumatic.  Right Ear: External ear normal.  Left Ear: External ear normal.  Mouth/Throat: Oropharynx is clear and moist. No oropharyngeal exudate.  Eyes: Pupils are equal, round, and reactive to light. Conjunctivae are normal. Right eye exhibits no discharge. Left eye exhibits no discharge. No scleral icterus.  Neck: Neck supple. No JVD present. No tracheal deviation present. No thyromegaly present.  Cardiovascular: Normal rate, regular rhythm and normal heart sounds.  Pulmonary/Chest: Effort normal and breath sounds normal. No stridor.  Abdominal: Bowel sounds are normal.  Lymphadenopathy:    He has no cervical adenopathy.  Neurological: He is alert and oriented to person, place, and time.  Skin: Skin is warm and dry. He is not  diaphoretic.  Psychiatric: He has a normal mood and affect. His behavior is normal.    BP 128/80   Pulse 87   Ht 5\' 7"  (1.702 m)   Wt 196 lb (88.9 kg)   SpO2 96%   BMI 30.70 kg/m  Wt Readings from Last 3 Encounters:  12/06/18 196 lb (88.9 kg)  11/25/18 185 lb (83.9 kg)  10/05/18 192 lb (87.1 kg)   BP Readings from Last 3 Encounters:  12/06/18 128/80  11/29/18 (!) 124/94  11/27/18 124/88   Health Maintenance Due  Topic Date Due  . HIV Screening  11/25/1989  . TETANUS/TDAP  11/25/1993  . INFLUENZA VACCINE  07/28/2018    There are no preventive care reminders to display  for this patient.  Lab Results  Component Value Date   TSH 3.10 12/08/2018   Lab Results  Component Value Date   WBC 8.6 11/29/2018   HGB 16.2 11/29/2018   HCT 46.1 11/29/2018   MCV 85.8 11/29/2018   PLT 152 11/29/2018   Lab Results  Component Value Date   NA 139 12/08/2018   K 3.9 12/08/2018   CO2 30 12/08/2018   GLUCOSE 98 12/08/2018   BUN 11 12/08/2018   CREATININE 0.90 12/08/2018   BILITOT 0.5 12/08/2018   ALKPHOS 84 12/08/2018   AST 21 12/08/2018   ALT 44 12/08/2018   PROT 7.4 12/08/2018   ALBUMIN 4.6 12/08/2018   CALCIUM 9.7 12/08/2018   ANIONGAP 9 11/29/2018   GFR 97.41 12/08/2018   Lab Results  Component Value Date   CHOL 209 (H) 12/08/2018   Lab Results  Component Value Date   HDL 47.60 12/08/2018   Lab Results  Component Value Date   LDLCALC 139 (H) 12/08/2018   Lab Results  Component Value Date   TRIG 114.0 12/08/2018   Lab Results  Component Value Date   CHOLHDL 4 12/08/2018   No results found for: HGBA1C    Assessment & Plan:   Problem List Items Addressed This Visit      Cardiovascular and Mediastinum   Coronary artery disease due to lipid rich plaque - Primary   Relevant Medications   atorvastatin (LIPITOR) 20 MG tablet   Other Relevant Orders   Comprehensive metabolic panel (Completed)   LDL cholesterol, direct (Completed)   Lipid panel  (Completed)     Digestive   Gastroesophageal reflux disease   Relevant Medications   omeprazole (PRILOSEC) 40 MG capsule     Other   Chest pain   Relevant Orders   Urinalysis, Routine w reflex microscopic (Completed)   Paresthesias   Relevant Orders   Vitamin B12 (Completed)   Comprehensive metabolic panel (Completed)   TSH (Completed)      Meds ordered this encounter  Medications  . omeprazole (PRILOSEC) 40 MG capsule    Sig: Take 1 capsule (40 mg total) by mouth daily.    Dispense:  30 capsule    Refill:  3  . atorvastatin (LIPITOR) 20 MG tablet    Sig: Take 1 tablet (20 mg total) by mouth daily.    Dispense:  90 tablet    Refill:  3    Follow-up: Return in about 2 weeks (around 12/20/2018).

## 2018-12-06 NOTE — Patient Instructions (Signed)

## 2018-12-07 ENCOUNTER — Telehealth: Payer: Self-pay | Admitting: Family Medicine

## 2018-12-07 NOTE — Telephone Encounter (Signed)
Copied from CRM [phone]. Topic: General - Call Back - No Documentation >> Dec 07, 2018 11:48 AM Lynne LoganHudson, Caryn D wrote: Reason for CRM: Pt called back and stated he saw Dr. Doreene BurkeKremer on 12/06/18. They discussed cholesterol medication and pt stated Dr. Doreene BurkeKremer said he would send in a rx for cholesterol medication as well as Prilosec however there is not a rx. Please advise pt. [phone]#[phone]   [facility] [place] - [address] [phone] (Phone) [phone] (Fax)

## 2018-12-07 NOTE — Telephone Encounter (Signed)
I called and spoke with patient; we went over information below & he verbalized understanding. He will come in for blood work.

## 2018-12-07 NOTE — Telephone Encounter (Signed)
prilosec sent to pharmacy.    Need to see labs first.

## 2018-12-08 ENCOUNTER — Other Ambulatory Visit (INDEPENDENT_AMBULATORY_CARE_PROVIDER_SITE_OTHER): Payer: BLUE CROSS/BLUE SHIELD

## 2018-12-08 DIAGNOSIS — I2510 Atherosclerotic heart disease of native coronary artery without angina pectoris: Secondary | ICD-10-CM

## 2018-12-08 DIAGNOSIS — I2583 Coronary atherosclerosis due to lipid rich plaque: Secondary | ICD-10-CM

## 2018-12-08 DIAGNOSIS — R202 Paresthesia of skin: Secondary | ICD-10-CM

## 2018-12-08 DIAGNOSIS — R079 Chest pain, unspecified: Secondary | ICD-10-CM

## 2018-12-08 LAB — URINALYSIS, ROUTINE W REFLEX MICROSCOPIC
Bilirubin Urine: NEGATIVE
HGB URINE DIPSTICK: NEGATIVE
KETONES UR: NEGATIVE
LEUKOCYTES UA: NEGATIVE
Nitrite: NEGATIVE
RBC / HPF: NONE SEEN (ref 0–?)
Specific Gravity, Urine: 1.025 (ref 1.000–1.030)
Total Protein, Urine: 30 — AB
Urine Glucose: NEGATIVE
Urobilinogen, UA: 0.2 (ref 0.0–1.0)
pH: 6 (ref 5.0–8.0)

## 2018-12-08 LAB — LIPID PANEL
Cholesterol: 209 mg/dL — ABNORMAL HIGH (ref 0–200)
HDL: 47.6 mg/dL (ref >39.00)
LDL Cholesterol: 139 mg/dL — ABNORMAL HIGH (ref 0–99)
NonHDL: 161.86
TRIGLYCERIDES: 114 mg/dL (ref 0.0–149.0)
Total CHOL/HDL Ratio: 4
VLDL: 22.8 mg/dL (ref 0.0–40.0)

## 2018-12-08 LAB — COMPREHENSIVE METABOLIC PANEL
ALT: 44 U/L (ref 0–53)
AST: 21 U/L (ref 0–37)
Albumin: 4.6 g/dL (ref 3.5–5.2)
Alkaline Phosphatase: 84 U/L (ref 39–117)
BUN: 11 mg/dL (ref 6–23)
CO2: 30 mEq/L (ref 19–32)
Calcium: 9.7 mg/dL (ref 8.4–10.5)
Chloride: 102 mEq/L (ref 96–112)
Creatinine, Ser: 0.9 mg/dL (ref 0.40–1.50)
GFR: 97.41 mL/min (ref >60.00)
Glucose, Bld: 98 mg/dL (ref 70–99)
Potassium: 3.9 mEq/L (ref 3.5–5.1)
Sodium: 139 mEq/L (ref 135–145)
Total Bilirubin: 0.5 mg/dL (ref 0.2–1.2)
Total Protein: 7.4 g/dL (ref 6.0–8.3)

## 2018-12-08 LAB — TSH: TSH: 3.1 u[IU]/mL (ref 0.35–4.50)

## 2018-12-08 LAB — LDL CHOLESTEROL, DIRECT: Direct LDL: 157 mg/dL

## 2018-12-08 LAB — VITAMIN B12: Vitamin B-12: 957 pg/mL — ABNORMAL HIGH (ref 211–911)

## 2018-12-08 MED ORDER — ATORVASTATIN CALCIUM 20 MG PO TABS: 20.0000 mg | ORAL_TABLET | Freq: Every day | ORAL | 3 refills | Status: DC

## 2018-12-08 NOTE — Addendum Note (Signed)
Addended by: Andrez GrimeKREMER, WILLIAM A on: 12/08/2018 11:57 AM   Modules accepted: Orders

## 2018-12-12 ENCOUNTER — Ambulatory Visit: Payer: BLUE CROSS/BLUE SHIELD | Admitting: Family Medicine

## 2018-12-12 ENCOUNTER — Encounter: Payer: Self-pay | Admitting: Family Medicine

## 2018-12-12 VITALS — BP 126/80 | HR 85 | Ht 67.0 in | Wt 196.0 lb

## 2018-12-12 DIAGNOSIS — F418 Other specified anxiety disorders: Secondary | ICD-10-CM | POA: Diagnosis not present

## 2018-12-12 DIAGNOSIS — R202 Paresthesia of skin: Secondary | ICD-10-CM

## 2018-12-12 MED ORDER — ESCITALOPRAM OXALATE 10 MG PO TABS: 10.0000 mg | ORAL_TABLET | Freq: Every day | ORAL | 0 refills | Status: DC

## 2018-12-12 NOTE — Progress Notes (Signed)
Established Patient Office Visit  Subjective:  Patient ID: Jake Schultz, male    DOB: 15-Apr-1976  Age: 42 y.o. MRN: [id number]  CC:  Chief Complaint  Patient presents with  . Follow-up    HPI [name] presents for follow-up.  He has started the Prilosec for reflux and has had no further chest pain.  He is tolerating the atorvastatin.  He has follow-up scheduled with his cardiologist middle of next month.  He has ongoing issues with migrating paresthesias about his body.  They seem to be pressure sensitive.  He sleeps on his left side.  Work-up with paresthesias on that side and vice versa.  Paresthesias have affected both feet is already discussed.  His hands turn white at times.  He is had no weakness, blurred vision.  Or difficulty swallowing.  Patient again tells me about how stressful his job is.  Has up to 3 beers in a given setting on the weekends.  Past Medical History:  Diagnosis Date  . CAD (coronary artery disease)     Past Surgical History:  Procedure Laterality Date  . heart stent    . LAPAROSCOPIC APPENDECTOMY N/A 11/26/2018   Procedure: APPENDECTOMY LAPAROSCOPIC;  Surgeon: Jimmye Norman, MD;  Location: [facility] OR;  Service: General;  Laterality: N/A;    Family History  Problem Relation Age of Onset  . Hypertension Mother   . Hypertension Father   . Diabetes Mellitus II Father     Social History   Socioeconomic History  . Marital status: Married    Spouse name: Not on file  . Number of children: Not on file  . Years of education: Not on file  . Highest education level: Not on file  Occupational History  . Not on file  Social Needs  . Financial resource strain: Not on file  . Food insecurity:    Worry: Not on file    Inability: Not on file  . Transportation needs:    Medical: Not on file    Non-medical: Not on file  Tobacco Use  . Smoking status: Never Smoker  . Smokeless tobacco: Never Used  Substance and Sexual Activity  . Alcohol use: Yes   Comment: has 3 beers in a given setting on the weekends  . Drug use: Never  . Sexual activity: Not on file  Lifestyle  . Physical activity:    Days per week: Not on file    Minutes per session: Not on file  . Stress: Not on file  Relationships  . Social connections:    Talks on phone: Not on file    Gets together: Not on file    Attends religious service: Not on file    Active member of club or organization: Not on file    Attends meetings of clubs or organizations: Not on file    Relationship status: Not on file  . Intimate partner violence:    Fear of current or ex partner: Not on file    Emotionally abused: Not on file    Physically abused: Not on file    Forced sexual activity: Not on file  Other Topics Concern  . Not on file  Social History Narrative  . Not on file    Outpatient Medications Prior to Visit  Medication Sig Dispense Refill  . atorvastatin (LIPITOR) 20 MG tablet Take 1 tablet (20 mg total) by mouth daily. 90 tablet 3  . omeprazole (PRILOSEC) 40 MG capsule Take 1 capsule (40 mg total) by  mouth daily. 30 capsule 3   No facility-administered medications prior to visit.     Allergies  Allergen Reactions  . Aspirin Other (See Comments)    Causes stomach issues  . Plavix [Clopidogrel Bisulfate] Other (See Comments)    Causes stomach problems    ROS Review of Systems  Constitutional: Negative for chills, diaphoresis, fatigue, fever and unexpected weight change.  HENT: Negative.   Eyes: Negative for photophobia and visual disturbance.  Respiratory: Negative.   Cardiovascular: Negative.   Gastrointestinal: Negative.   Endocrine: Negative for polyphagia and polyuria.  Genitourinary: Negative.   Musculoskeletal: Negative for arthralgias and myalgias.  Allergic/Immunologic: Negative for immunocompromised state.  Neurological: Positive for light-headedness and numbness. Negative for dizziness, tremors, syncope, facial asymmetry, speech difficulty and  weakness.  Hematological: Does not bruise/bleed easily.   Depression screen [facility] 2/9 12/12/2018 12/06/2018  Decreased Interest 1 0  Down, Depressed, Hopeless 1 0  PHQ - 2 Score 2 0  Altered sleeping 1 1  Tired, decreased energy 2 2  Change in appetite 0 0  Feeling bad or failure about yourself  2 0  Trouble concentrating 1 0  Moving slowly or fidgety/restless 0 0  Suicidal thoughts 0 0  PHQ-9 Score 8 3      Objective:    Physical Exam  Constitutional: He is oriented to person, place, and time. He appears well-developed and well-nourished. No distress.  HENT:  Head: Normocephalic and atraumatic.  Right Ear: External ear normal.  Left Ear: External ear normal.  Mouth/Throat: Oropharynx is clear and moist. No oropharyngeal exudate.  Eyes: Pupils are equal, round, and reactive to light. Conjunctivae are normal. Right eye exhibits no discharge. Left eye exhibits no discharge. No scleral icterus.  Neck: Neck supple. No JVD present. No tracheal deviation present. No thyromegaly present.  Cardiovascular: Normal rate, regular rhythm and normal heart sounds.  Pulses:      Radial pulses are 2+ on the right side and 2+ on the left side.       Dorsalis pedis pulses are 2+ on the right side and 2+ on the left side.       Posterior tibial pulses are 2+ on the right side and 2+ on the left side.  Pulmonary/Chest: Effort normal and breath sounds normal. No stridor.  Abdominal: Soft. Bowel sounds are normal. He exhibits no distension. There is no abdominal tenderness. There is no rebound.  Musculoskeletal: Normal range of motion.        General: No deformity or edema.  Lymphadenopathy:    He has no cervical adenopathy.  Neurological: He is alert and oriented to person, place, and time. He has normal strength. No cranial nerve deficit. He displays a negative Romberg sign.  Skin: Skin is warm and dry. He is not diaphoretic.  Psychiatric: He has a normal mood and affect. His behavior is normal.      BP 126/80   Pulse 85   Ht 5\' 7"  (1.702 m)   Wt 196 lb (88.9 kg)   SpO2 97%   BMI 30.70 kg/m  Wt Readings from Last 3 Encounters:  12/12/18 196 lb (88.9 kg)  12/06/18 196 lb (88.9 kg)  11/25/18 185 lb (83.9 kg)   BP Readings from Last 3 Encounters:  12/12/18 126/80  12/06/18 128/80  11/29/18 (!) 124/94   Health Maintenance Due  Topic Date Due  . HIV Screening  11/25/1989  . TETANUS/TDAP  11/25/1993  . INFLUENZA VACCINE  07/28/2018    There are  no preventive care reminders to display for this patient.  Lab Results  Component Value Date   TSH 3.10 12/08/2018   Lab Results  Component Value Date   WBC 8.6 11/29/2018   HGB 16.2 11/29/2018   HCT 46.1 11/29/2018   MCV 85.8 11/29/2018   PLT 152 11/29/2018   Lab Results  Component Value Date   NA 139 12/08/2018   K 3.9 12/08/2018   CO2 30 12/08/2018   GLUCOSE 98 12/08/2018   BUN 11 12/08/2018   CREATININE 0.90 12/08/2018   BILITOT 0.5 12/08/2018   ALKPHOS 84 12/08/2018   AST 21 12/08/2018   ALT 44 12/08/2018   PROT 7.4 12/08/2018   ALBUMIN 4.6 12/08/2018   CALCIUM 9.7 12/08/2018   ANIONGAP 9 11/29/2018   GFR 97.41 12/08/2018   Lab Results  Component Value Date   CHOL 209 (H) 12/08/2018   Lab Results  Component Value Date   HDL 47.60 12/08/2018   Lab Results  Component Value Date   LDLCALC 139 (H) 12/08/2018   Lab Results  Component Value Date   TRIG 114.0 12/08/2018   Lab Results  Component Value Date   CHOLHDL 4 12/08/2018   No results found for: HGBA1C    Assessment & Plan:   Problem List Items Addressed This Visit      Other   Paresthesias - Primary   Relevant Orders   Ambulatory referral to Neurology   Depression with anxiety   Relevant Medications   escitalopram (LEXAPRO) 10 MG tablet      Meds ordered this encounter  Medications  . escitalopram (LEXAPRO) 10 MG tablet    Sig: Take 1 tablet (10 mg total) by mouth daily.    Dispense:  30 tablet    Refill:  0   Believe  that patient's symptoms could be anxiety related but he would like to see a neurologist first.  Patient agrees to start treatment for his anxiety.  Is taking Xanax in the past and says that Prozac was not helpful. Follow-up: Return in about 1 month (around 01/12/2019).

## 2018-12-12 NOTE — Patient Instructions (Signed)

## 2018-12-15 ENCOUNTER — Telehealth: Payer: Self-pay

## 2018-12-15 NOTE — Telephone Encounter (Signed)
Okay to place a new referral?    Copied from CRM [id number]. Topic: Referral - Status >> Dec 15, 2018  2:03 PM Marylen PontoMcneil, Jake Schultz wrote: Reason for CRM: Pt stated that the appt with the Neurologist is around 02/12/19 and he needs something a lot sooner. Pt requests referral to Neurologist that can see him sooner. Cb# [phone]

## 2018-12-16 ENCOUNTER — Ambulatory Visit: Payer: BLUE CROSS/BLUE SHIELD | Admitting: Family Medicine

## 2018-12-16 ENCOUNTER — Encounter: Payer: Self-pay | Admitting: Family Medicine

## 2018-12-16 VITALS — BP 120/80 | HR 85 | Temp 98.5°F | Ht 67.0 in | Wt 196.0 lb

## 2018-12-16 DIAGNOSIS — R6883 Chills (without fever): Secondary | ICD-10-CM | POA: Diagnosis not present

## 2018-12-16 DIAGNOSIS — R202 Paresthesia of skin: Secondary | ICD-10-CM | POA: Diagnosis not present

## 2018-12-16 LAB — CBC
HCT: 46.3 % (ref 39.0–52.0)
Hemoglobin: 16.1 g/dL (ref 13.0–17.0)
MCHC: 34.7 g/dL (ref 30.0–36.0)
MCV: 88.3 fl (ref 78.0–100.0)
Platelets: 158 10*3/uL (ref 150.0–400.0)
RBC: 5.24 Mil/uL (ref 4.22–5.81)
RDW: 12.6 % (ref 11.5–15.5)
WBC: 10 10*3/uL (ref 4.0–10.5)

## 2018-12-16 LAB — VITAMIN B12: Vitamin B-12: 895 pg/mL (ref 211–911)

## 2018-12-16 LAB — SEDIMENTATION RATE: Sed Rate: 6 mm/hr (ref 0–15)

## 2018-12-16 NOTE — Progress Notes (Addendum)
Established Patient Office Visit  Subjective:  Patient ID: Jake Schultz, male    DOB: 21-Jun-1976  Age: 42 y.o. MRN: [id number]  CC:  Chief Complaint  Patient presents with  . Chills    HPI Jake BordersMiguel Schultz presents for evaluation of chills since last night. He is afebrile.  Patient has no other localizing symptoms but the migratory paresthesias and what sounds like fasciculations that have been occurring over the last 4 months.  Paresthesias tend to be pressure sensitive.  In other words he lies on his left side or sleeps on his left side he wakes up with paresthesias on that side.  He feels fatigued in the morning.  He had a tick bite 2 years ago and wonders whether or not that has something to do with the symptoms he has been experiencing.  He is concerned that his life may be in danger as a result of the symptoms over the last 4 months.  Past Medical History:  Diagnosis Date  . CAD (coronary artery disease)     Past Surgical History:  Procedure Laterality Date  . heart stent    . LAPAROSCOPIC APPENDECTOMY N/A 11/26/2018   Procedure: APPENDECTOMY LAPAROSCOPIC;  Surgeon: Jimmye NormanWyatt, James, MD;  [facility];  Service: General;  Laterality: N/A;    Family History  Problem Relation Age of Onset  . Hypertension Mother   . Hypertension Father   . Diabetes Mellitus II Father     Social History   Socioeconomic History  . Marital status: Married    Spouse name: Not on file  . Number of children: Not on file  . Years of education: Not on file  . Highest education level: Not on file  Occupational History  . Not on file  Social Needs  . Financial resource strain: Not on file  . Food insecurity:    Worry: Not on file    Inability: Not on file  . Transportation needs:    Medical: Not on file    Non-medical: Not on file  Tobacco Use  . Smoking status: Never Smoker  . Smokeless tobacco: Never Used  Substance and Sexual Activity  . Alcohol use: Yes    Comment: has 3 beers in a  given setting on the weekends  . Drug use: Never  . Sexual activity: Not on file  Lifestyle  . Physical activity:    Days per week: Not on file    Minutes per session: Not on file  . Stress: Not on file  Relationships  . Social connections:    Talks on phone: Not on file    Gets together: Not on file    Attends religious service: Not on file    Active member of club or organization: Not on file    Attends meetings of clubs or organizations: Not on file    Relationship status: Not on file  . Intimate partner violence:    Fear of current or ex partner: Not on file    Emotionally abused: Not on file    Physically abused: Not on file    Forced sexual activity: Not on file  Other Topics Concern  . Not on file  Social History Narrative  . Not on file    Outpatient Medications Prior to Visit  Medication Sig Dispense Refill  . atorvastatin (LIPITOR) 20 MG tablet Take 1 tablet (20 mg total) by mouth daily. 90 tablet 3  . escitalopram (LEXAPRO) 10 MG tablet Take 1 tablet (10 mg total)  by mouth daily. 30 tablet 0  . omeprazole (PRILOSEC) 40 MG capsule Take 1 capsule (40 mg total) by mouth daily. 30 capsule 3   No facility-administered medications prior to visit.     Allergies  Allergen Reactions  . Aspirin Other (See Comments)    Causes stomach issues  . Plavix [Clopidogrel Bisulfate] Other (See Comments)    Causes stomach problems    ROS Review of Systems  Constitutional: Positive for chills and fatigue. Negative for appetite change, diaphoresis, fever and unexpected weight change.  HENT: Positive for congestion. Negative for dental problem, ear discharge, ear pain, postnasal drip, rhinorrhea, sinus pressure and sinus pain.   Eyes: Negative for photophobia and visual disturbance.  Respiratory: Negative.   Cardiovascular: Negative.   Gastrointestinal: Negative.   Genitourinary: Negative.   Musculoskeletal: Negative for arthralgias and myalgias.  Skin: Negative for pallor  and rash.  Allergic/Immunologic: Negative for immunocompromised state.  Neurological: Positive for light-headedness and numbness. Negative for tremors, seizures, speech difficulty and headaches.  Psychiatric/Behavioral: Positive for dysphoric mood. The patient is nervous/anxious.       Objective:    Physical Exam  Constitutional: He is oriented to person, place, and time. He appears well-developed and well-nourished. No distress.  HENT:  Head: Normocephalic and atraumatic.  Right Ear: External ear normal. Tympanic membrane is not injected, not perforated, not erythematous, not retracted and not bulging. No middle ear effusion.  Left Ear: External ear normal. Tympanic membrane is retracted. Tympanic membrane is not injected, not perforated, not erythematous and not bulging.  No middle ear effusion.  Eyes: Pupils are equal, round, and reactive to light. Conjunctivae and EOM are normal. Right eye exhibits no discharge. Left eye exhibits no discharge. No scleral icterus.  Neck: Neck supple. No JVD present. No tracheal deviation present. No thyromegaly present.  Cardiovascular: Normal rate, regular rhythm and normal heart sounds.  Pulmonary/Chest: Effort normal and breath sounds normal. No stridor. No respiratory distress. He has no wheezes. He has no rales.  Abdominal: Bowel sounds are normal.  Lymphadenopathy:    He has no cervical adenopathy.  Neurological: He is alert and oriented to person, place, and time.  Skin: Skin is warm and dry. He is not diaphoretic.  Psychiatric: He has a normal mood and affect. His behavior is normal.    BP 120/80   Pulse 85   Temp 98.5 F (36.9 C) (Oral)   Ht 5\' 7"  (1.702 m)   Wt 196 lb (88.9 kg)   SpO2 97%   BMI 30.70 kg/m  Wt Readings from Last 3 Encounters:  12/16/18 196 lb (88.9 kg)  12/12/18 196 lb (88.9 kg)  12/06/18 196 lb (88.9 kg)   BP Readings from Last 3 Encounters:  12/16/18 120/80  12/12/18 126/80  12/06/18 128/80   Guideline  developer:  UpToDate (see UpToDate for funding source) Date Released: June 2014  Health Maintenance Due  Topic Date Due  . HIV Screening  11/25/1989  . TETANUS/TDAP  11/25/1993  . INFLUENZA VACCINE  07/28/2018    There are no preventive care reminders to display for this patient.  Lab Results  Component Value Date   TSH 3.10 12/08/2018   Lab Results  Component Value Date   WBC 8.6 11/29/2018   HGB 16.2 11/29/2018   HCT 46.1 11/29/2018   MCV 85.8 11/29/2018   PLT 152 11/29/2018   Lab Results  Component Value Date   NA 139 12/08/2018   K 3.9 12/08/2018   CO2 30 12/08/2018  GLUCOSE 98 12/08/2018   BUN 11 12/08/2018   CREATININE 0.90 12/08/2018   BILITOT 0.5 12/08/2018   ALKPHOS 84 12/08/2018   AST 21 12/08/2018   ALT 44 12/08/2018   PROT 7.4 12/08/2018   ALBUMIN 4.6 12/08/2018   CALCIUM 9.7 12/08/2018   ANIONGAP 9 11/29/2018   GFR 97.41 12/08/2018   Lab Results  Component Value Date   CHOL 209 (H) 12/08/2018   Lab Results  Component Value Date   HDL 47.60 12/08/2018   Lab Results  Component Value Date   LDLCALC 139 (H) 12/08/2018   Lab Results  Component Value Date   TRIG 114.0 12/08/2018   Lab Results  Component Value Date   CHOLHDL 4 12/08/2018   No results found for: HGBA1C    Assessment & Plan:   Problem List Items Addressed This Visit      Other   Paresthesias - Primary   Relevant Orders   Vitamin B12   Sedimentation rate   Ambulatory referral to Infectious Disease   Ambulatory referral to Neurology    Other Visit Diagnoses    Chills       Relevant Orders   CBC   Ambulatory referral to Infectious Disease      No orders of the defined types were placed in this encounter. Patient is concerned that he is suffering from an undiagnosed infectious disease process that could possibly be threatening his life.  I suggested that we give Lexapro some time to work for him and help relieve anxiety before proceeding with additional lab work  and referrals.  He is anxious that his life may be in danger and would like a work-up and referrals to proceed.  Follow-up: Return in about 4 weeks (around 01/13/2019).    Patient declined the above ordered blood work because he wanted labs drawn for tickborne disease.  We discussed this issue when he was being seen and I told him that it was unlikely that a tick bite 2 years ago would be responsible for his current symptoms.  I suggested that infectious disease may choose to work him up for take borne illness but labs ordered today would be useful and there potential evaluation.

## 2018-12-26 ENCOUNTER — Ambulatory Visit: Payer: BLUE CROSS/BLUE SHIELD | Admitting: Family Medicine

## 2019-01-03 ENCOUNTER — Ambulatory Visit: Payer: BLUE CROSS/BLUE SHIELD | Admitting: Cardiology

## 2019-01-05 ENCOUNTER — Encounter (HOSPITAL_COMMUNITY): Payer: Self-pay

## 2019-01-05 ENCOUNTER — Emergency Department (HOSPITAL_COMMUNITY)
Admission: EM | Admit: 2019-01-05 | Discharge: 2019-01-06 | Disposition: A | Discharge status: Home / Self Care | Payer: BLUE CROSS/BLUE SHIELD | Attending: Emergency Medicine | Admitting: Emergency Medicine

## 2019-01-05 DIAGNOSIS — I2510 Atherosclerotic heart disease of native coronary artery without angina pectoris: Secondary | ICD-10-CM | POA: Diagnosis not present

## 2019-01-05 DIAGNOSIS — R109 Unspecified abdominal pain: Secondary | ICD-10-CM | POA: Diagnosis not present

## 2019-01-05 DIAGNOSIS — Z79899 Other long term (current) drug therapy: Secondary | ICD-10-CM | POA: Insufficient documentation

## 2019-01-05 LAB — COMPREHENSIVE METABOLIC PANEL
ALBUMIN: 3.9 g/dL (ref 3.5–5.0)
ALT: 32 U/L (ref 0–44)
AST: 26 U/L (ref 15–41)
Alkaline Phosphatase: 63 U/L (ref 38–126)
Anion gap: 8 (ref 5–15)
BUN: 12 mg/dL (ref 6–20)
CO2: 26 mmol/L (ref 22–32)
Calcium: 9.2 mg/dL (ref 8.9–10.3)
Chloride: 105 mmol/L (ref 98–111)
Creatinine, Ser: 0.8 mg/dL (ref 0.61–1.24)
GFR calc Af Amer: >60 mL/min (ref >60)
GFR calc non Af Amer: >60 mL/min (ref >60)
Glucose, Bld: 93 mg/dL (ref 70–99)
Potassium: 3.5 mmol/L (ref 3.5–5.1)
SODIUM: 139 mmol/L (ref 135–145)
Total Bilirubin: 0.7 mg/dL (ref 0.3–1.2)
Total Protein: 7.3 g/dL (ref 6.5–8.1)

## 2019-01-05 LAB — CBC
HCT: 47.3 % (ref 39.0–52.0)
HEMOGLOBIN: 16.5 g/dL (ref 13.0–17.0)
MCH: 29.9 pg (ref 26.0–34.0)
MCHC: 34.9 g/dL (ref 30.0–36.0)
MCV: 85.8 fL (ref 80.0–100.0)
Platelets: UNDETERMINED 10*3/uL (ref 150–400)
RBC: 5.51 MIL/uL (ref 4.22–5.81)
RDW: 11.9 % (ref 11.5–15.5)
WBC: 8.2 10*3/uL (ref 4.0–10.5)
nRBC: 0 % (ref 0.0–0.2)

## 2019-01-05 LAB — LIPASE, BLOOD: Lipase: 27 U/L (ref 11–51)

## 2019-01-05 MED ORDER — KETOROLAC TROMETHAMINE 15 MG/ML IJ SOLN
15.0000 mg | Freq: Once | INTRAMUSCULAR | Status: AC
  Administered 2019-01-05: 15 mg via INTRAVENOUS
  Filled 2019-01-05: qty 1

## 2019-01-05 MED ORDER — ACETAMINOPHEN 500 MG PO TABS
1000.0000 mg | ORAL_TABLET | Freq: Once | ORAL | Status: AC
  Administered 2019-01-05: 1000 mg via ORAL
  Filled 2019-01-05: qty 2

## 2019-01-05 MED ORDER — SODIUM CHLORIDE 0.9 % IV BOLUS
1000.0000 mL | Freq: Once | INTRAVENOUS | Status: AC
  Administered 2019-01-05: 1000 mL via INTRAVENOUS

## 2019-01-05 NOTE — ED Provider Notes (Signed)
[facility] EMERGENCY DEPARTMENT Provider Note   CSN: [id number] Arrival date & time: 01/05/19  1823     History   Chief Complaint Chief Complaint  Patient presents with  . Flank Pain    HPI Jake Schultz is a 43 y.o. male.  43 yo M with a chief complaint of right side pain.  This been going on for the past couple months.  The patient was seen in the ED a few times for this.  He has been seen by his family physician for this as well.  At one point he came to the ED and had worsening pain in the anterior aspect of his abdomen and then was diagnosed with acute appendicitis.  He had his appendix removed but continues to have pain.  He denies fevers or chills worse with positioning and palpation.  Seems improved with massage.  Patient has been waking up and feeling that he has tingling in bilateral legs.  He denies loss of bowel or bladder denies loss of peritoneal sensation.  Denies numbness or weakness to the legs.  The history is provided by the patient.  Flank Pain  This is a new problem. The current episode started more than 1 week ago. The problem occurs constantly. The problem has not changed since onset.Associated symptoms include abdominal pain. Pertinent negatives include no chest pain, no headaches and no shortness of breath. Nothing aggravates the symptoms. Nothing relieves the symptoms. He has tried nothing for the symptoms. The treatment provided no relief.    Past Medical History:  Diagnosis Date  . CAD (coronary artery disease)     Patient Active Problem List   Diagnosis Date Noted  . Depression with anxiety 12/12/2018  . Coronary artery disease due to lipid rich plaque 12/06/2018  . Chest pain 12/06/2018  . Paresthesias 12/06/2018  . Gastroesophageal reflux disease 12/06/2018  . Appendicitis 11/25/2018    Past Surgical History:  Procedure Laterality Date  . heart stent    . LAPAROSCOPIC APPENDECTOMY N/A 11/26/2018   Procedure: APPENDECTOMY  LAPAROSCOPIC;  Surgeon: Jimmye Norman, MD;  Location: [facility] OR;  Service: General;  Laterality: N/A;        Home Medications    Prior to Admission medications   Medication Sig Start Date End Date Taking? Authorizing Provider  atorvastatin (LIPITOR) 20 MG tablet Take 1 tablet (20 mg total) by mouth daily. 12/08/18   Mliss Sax, MD  escitalopram (LEXAPRO) 10 MG tablet Take 1 tablet (10 mg total) by mouth daily. 12/12/18   Mliss Sax, MD  omeprazole (PRILOSEC) 40 MG capsule Take 1 capsule (40 mg total) by mouth daily. 12/06/18   Mliss Sax, MD    Family History Family History  Problem Relation Age of Onset  . Hypertension Mother   . Hypertension Father   . Diabetes Mellitus II Father     Social History Social History   Tobacco Use  . Smoking status: Never Smoker  . Smokeless tobacco: Never Used  Substance Use Topics  . Alcohol use: Yes    Comment: has 3 beers in a given setting on the weekends  . Drug use: Never     Allergies   Aspirin and Plavix [clopidogrel bisulfate]   Review of Systems Review of Systems  Constitutional: Negative for chills and fever.  HENT: Negative for congestion and facial swelling.   Eyes: Negative for discharge and visual disturbance.  Respiratory: Negative for shortness of breath.   Cardiovascular: Negative for chest pain  and palpitations.  Gastrointestinal: Positive for abdominal pain. Negative for diarrhea and vomiting.  Genitourinary: Positive for dysuria and flank pain. Negative for discharge, penile pain and penile swelling.  Musculoskeletal: Negative for arthralgias and myalgias.  Skin: Negative for color change and rash.  Neurological: Negative for tremors, syncope and headaches.  Psychiatric/Behavioral: Negative for confusion and dysphoric mood.     Physical Exam Updated Vital Signs BP 113/83 (BP Location: Right Arm)   Pulse 72   Temp 98.6 F (37 C) (Oral)   Resp 16   SpO2 100%   Physical  Exam Vitals signs and nursing note reviewed.  Constitutional:      Appearance: He is well-developed.  HENT:     Head: Normocephalic and atraumatic.  Eyes:     Pupils: Pupils are equal, round, and reactive to light.  Neck:     Musculoskeletal: Normal range of motion and neck supple.     Vascular: No JVD.  Cardiovascular:     Rate and Rhythm: Normal rate and regular rhythm.     Heart sounds: No murmur. No friction rub. No gallop.   Pulmonary:     Effort: No respiratory distress.     Breath sounds: No wheezing.  Abdominal:     General: There is no distension.     Tenderness: There is no abdominal tenderness. There is no guarding or rebound.  Musculoskeletal: Normal range of motion.        General: Tenderness present.     Comments: Mild tenderness about the right flank, along the posterior axillary line.  No appreciable rash.  No midline spinal tenderness.  Mild tenderness to the right lower portion of the abdomen just above the inguinal ligament.  There are no masses.  Mostly healed wounds from recent laparoscopy.   Pulse motor and sensation is intact to bilateral lower extremities  Skin:    Coloration: Skin is not pale.     Findings: No rash.  Neurological:     Mental Status: He is alert and oriented to person, place, and time.  Psychiatric:        Behavior: Behavior normal.      ED Treatments / Results  Labs (all labs ordered are listed, but only abnormal results are displayed) Labs Reviewed  URINALYSIS, ROUTINE W REFLEX MICROSCOPIC - Abnormal; Notable for the following components:      Result Value   APPearance HAZY (*)    All other components within normal limits  LIPASE, BLOOD  COMPREHENSIVE METABOLIC PANEL  CBC    EKG None  Radiology Ct Renal Stone Study  Result Date: 01/06/2019 CLINICAL DATA:  Right-sided flank pain for 2 days. Appendectomy 1 week ago. EXAM: CT ABDOMEN AND PELVIS WITHOUT CONTRAST TECHNIQUE: Multidetector CT imaging of the abdomen and  pelvis was performed following the standard protocol without IV contrast. COMPARISON:  11/25/2018 FINDINGS: Lower chest: Lung bases are clear. Hepatobiliary: No focal liver abnormality is seen. No gallstones, gallbladder wall thickening, or biliary dilatation. Pancreas: Unremarkable. No pancreatic ductal dilatation or surrounding inflammatory changes. Spleen: Normal in size without focal abnormality. Adrenals/Urinary Tract: Adrenal glands are unremarkable. Kidneys are normal, without renal calculi, focal lesion, or hydronephrosis. Bladder is unremarkable. Stomach/Bowel: Stomach, small bowel, and colon are mostly decompressed. Scattered stool in the colon. No wall thickening or inflammatory changes appreciated. Surgical absence of the appendix. No residual postoperative fluid collection or hematoma suggested. Vascular/Lymphatic: No significant vascular findings are present. No enlarged abdominal or pelvic lymph nodes. Reproductive: Prostate is unremarkable. Other: Minimal  periumbilical hernia containing fat. No free air or free fluid in the abdomen. Musculoskeletal: No acute or significant osseous findings. IMPRESSION: No renal or ureteral stone or obstruction. Minimal periumbilical hernia containing fat. No acute process demonstrated in the abdomen or pelvis on noncontrast imaging. Electronically Signed   By: Burman NievesWilliam  Stevens M.D.   On: 01/06/2019 00:51    Procedures Procedures (including critical care time)  Medications Ordered in ED Medications  acetaminophen (TYLENOL) tablet 1,000 mg (1,000 mg Oral Given 01/05/19 2350)  ketorolac (TORADOL) 15 MG/ML injection 15 mg (15 mg Intravenous Given 01/05/19 2350)  sodium chloride 0.9 % bolus 1,000 mL (0 mLs Intravenous Stopped 01/06/19 0103)     Initial Impression / Assessment and Plan / ED Course  I have reviewed the triage vital signs and the nursing notes.  Pertinent labs & imaging results that were available during my care of the patient were reviewed by me  and considered in my medical decision making (see chart for details).     43 yo M with a chief complaint of right side pain.  The patient had his appendix removed about a month ago.  He is having ongoing symptoms to the area.  Will obtain a CT.  CT scan negative for acute pathology.  The patient's urine is negative for infection or blood.  His LFTs and lipase are unremarkable.  No leukocytosis.  Most likely musculoskeletal.  Discharge home.  2:19 AM:  I have discussed the diagnosis/risks/treatment options with the patient and family and believe the pt to be eligible for discharge home to follow-up with PCP. We also discussed returning to the ED immediately if new or worsening sx occur. We discussed the sx which are most concerning (e.g., sudden worsening pain, fever, inability to tolerate by mouth) that necessitate immediate return. Medications administered to the patient during their visit and any new prescriptions provided to the patient are listed below.  Medications given during this visit Medications  acetaminophen (TYLENOL) tablet 1,000 mg (1,000 mg Oral Given 01/05/19 2350)  ketorolac (TORADOL) 15 MG/ML injection 15 mg (15 mg Intravenous Given 01/05/19 2350)  sodium chloride 0.9 % bolus 1,000 mL (0 mLs Intravenous Stopped 01/06/19 0103)      The patient appears reasonably screen and/or stabilized for discharge and I doubt any other medical condition or other [facility] requiring further screening, evaluation, or treatment in the ED at this time prior to discharge.    Final Clinical Impressions(s) / ED Diagnoses   Final diagnoses:  Flank pain    ED Discharge Orders    None       Melene PlanFloyd, Denzal Meir, DO 01/06/19 [id number]

## 2019-01-05 NOTE — ED Triage Notes (Signed)
Pt reports right sided flank pain that started today after eating. Pt recently had appendix removed 1 month ago. Pt endorsing nausea, no vomiting. Pt a.o, nad noted in triage

## 2019-01-06 ENCOUNTER — Emergency Department (HOSPITAL_COMMUNITY): Payer: BLUE CROSS/BLUE SHIELD

## 2019-01-06 LAB — URINALYSIS, ROUTINE W REFLEX MICROSCOPIC
Bilirubin Urine: NEGATIVE
Glucose, UA: NEGATIVE mg/dL
Hgb urine dipstick: NEGATIVE
Ketones, ur: NEGATIVE mg/dL
Leukocytes, UA: NEGATIVE
Nitrite: NEGATIVE
Protein, ur: NEGATIVE mg/dL
Specific Gravity, Urine: 1.021 (ref 1.005–1.030)
pH: 6 (ref 5.0–8.0)

## 2019-01-06 NOTE — Discharge Instructions (Signed)
Your CT scan was normal.  Most likely your pain is muscular.  Please discuss this with your family physician.  Return for worsening pain fever or inability to eat or drink.  Take 4 over the counter ibuprofen tablets 3 times a day or 2 over-the-counter naproxen tablets twice a day for pain. Also take tylenol 1000mg (2 extra strength) four times a day.

## 2019-01-06 NOTE — ED Notes (Signed)
Urine culture sent to lab with urine sample.  

## 2019-01-06 NOTE — ED Notes (Signed)
Patient transported to CT scan . 

## 2019-01-11 ENCOUNTER — Ambulatory Visit (INDEPENDENT_AMBULATORY_CARE_PROVIDER_SITE_OTHER): Payer: BLUE CROSS/BLUE SHIELD | Admitting: Infectious Disease

## 2019-01-11 ENCOUNTER — Encounter: Payer: Self-pay | Admitting: Infectious Disease

## 2019-01-11 VITALS — BP 127/88 | HR 69 | Temp 98.3°F | Wt 201.0 lb

## 2019-01-11 DIAGNOSIS — R1031 Right lower quadrant pain: Secondary | ICD-10-CM

## 2019-01-11 DIAGNOSIS — R200 Anesthesia of skin: Secondary | ICD-10-CM

## 2019-01-11 DIAGNOSIS — R6883 Chills (without fever): Secondary | ICD-10-CM | POA: Diagnosis not present

## 2019-01-11 DIAGNOSIS — R29898 Other symptoms and signs involving the musculoskeletal system: Secondary | ICD-10-CM

## 2019-01-11 DIAGNOSIS — R202 Paresthesia of skin: Secondary | ICD-10-CM

## 2019-01-11 DIAGNOSIS — F418 Other specified anxiety disorders: Secondary | ICD-10-CM

## 2019-01-11 DIAGNOSIS — K0889 Other specified disorders of teeth and supporting structures: Secondary | ICD-10-CM

## 2019-01-11 HISTORY — DX: Chills (without fever): R68.83

## 2019-01-11 HISTORY — DX: Other specified disorders of teeth and supporting structures: K08.89

## 2019-01-11 HISTORY — DX: Other symptoms and signs involving the musculoskeletal system: R29.898

## 2019-01-11 HISTORY — DX: Anesthesia of skin: R20.0

## 2019-01-11 NOTE — Progress Notes (Signed)
Reason for consult: Chills paresthesias back pain malaise  Requesting Physician: Dr. Doreene BurkeKremer  Subjective:    Patient ID: Jake Schultz, male    DOB: [birth year], 43 y.o.   MRN: [id number]  HPI  43 year old Latino man who has known coronary artery disease status post placement of stent who this fall had been developing flank pain radiating to his abdomen abdominal pain malaise and chills.  He had a CT scan of the abdomen and pelvis done in November 2019 which showed:  The appendix is dilated measuring 11 mm (coronal image 61 of series 6) with mucosal hyperenhancement and subtle surrounding inflammatory changes. No appendicoliths noted. No periappendiceal abscess or signs of frank Perforation.  Underwent laparoscopic appendectomy by Dr. Lindie SpruceWyatt who found evidence of an enlarged appendix with some vascular congestion grossly on exam.  Interestingly the pathology report is fairly unremarkable of the appendix.  The case despite removal of his appendix he continued to have persistent pain in his flank area as well as his abdomen and then developed additional paresthesias with numbness in his feet bilaterally and feelings of coldness in his feet.  He is also had similar feelings of coldness in his hands and weekend hand grip at times.  Some dental pain in his lower tooth over the last few days but the chronic symptoms that are predominated have been the flank abdominal pain chills and paresthesias.  Has been seen by his primary care physician and had did have a sed rate and C-reactive protein in December which were completely normal.  He had a CT renal study which was negative on January 06, 2019.  To a CT of the abdomen pelvis done in November he did have an ultrasound the right upper quadrant which is shown evidence of fatty liver  Had a chest x-ray in November 29, 2018 which was completely normal.  He and his wife have some concern that his constellation of symptoms could represent an  underlying infection.  They have been worried about infections transmitted by ticks and exposure to mold.  Reassured them that tickborne infections do not cause the type of symptoms that he is describing.  Also he would not have these count of symptoms from a mold infection given his normal immune status.  I do think is reasonable to work him up for an indolent infection such as endocarditis or a discitis of his lumbar spine.  Also think he clearly does need to be seen by neurology.     Past Medical History:  Diagnosis Date  . CAD (coronary artery disease)   . Chills 01/11/2019  . Numbness and tingling of both feet 01/11/2019  . Pain, dental 01/11/2019  . Weakness of both hands 01/11/2019    Past Surgical History:  Procedure Laterality Date  . heart stent    . LAPAROSCOPIC APPENDECTOMY N/A 11/26/2018   Procedure: APPENDECTOMY LAPAROSCOPIC;  Surgeon: Jimmye NormanWyatt, James, MD;  [facility];  Service: General;  Laterality: N/A;    Family History  Problem Relation Age of Onset  . Hypertension Mother   . Hypertension Father   . Diabetes Mellitus II Father       Social History   Socioeconomic History  . Marital status: Married    Spouse name: Not on file  . Number of children: Not on file  . Years of education: Not on file  . Highest education level: Not on file  Occupational History  . Not on file  Social Needs  . Financial resource strain: Not  on file  . Food insecurity:    Worry: Not on file    Inability: Not on file  . Transportation needs:    Medical: Not on file    Non-medical: Not on file  Tobacco Use  . Smoking status: Never Smoker  . Smokeless tobacco: Never Used  Substance and Sexual Activity  . Alcohol use: Yes    Comment: has 3 beers in a given setting on the weekends  . Drug use: Never  . Sexual activity: Not on file  Lifestyle  . Physical activity:    Days per week: Not on file    Minutes per session: Not on file  . Stress: Not on file  Relationships    . Social connections:    Talks on phone: Not on file    Gets together: Not on file    Attends religious service: Not on file    Active member of club or organization: Not on file    Attends meetings of clubs or organizations: Not on file    Relationship status: Not on file  Other Topics Concern  . Not on file  Social History Narrative  . Not on file    Allergies  Allergen Reactions  . Aspirin Other (See Comments)    Causes stomach issues  . Plavix [Clopidogrel Bisulfate] Other (See Comments)    Causes stomach problems     Current Outpatient Medications:  .  atorvastatin (LIPITOR) 20 MG tablet, Take 1 tablet (20 mg total) by mouth daily., Disp: 90 tablet, Rfl: 3 .  escitalopram (LEXAPRO) 10 MG tablet, Take 1 tablet (10 mg total) by mouth daily., Disp: 30 tablet, Rfl: 0 .  omeprazole (PRILOSEC) 40 MG capsule, Take 1 capsule (40 mg total) by mouth daily., Disp: 30 capsule, Rfl: 3   Review of Systems  Constitutional: Positive for chills and fatigue. Negative for activity change, appetite change, diaphoresis, fever and unexpected weight change.  HENT: Negative for congestion, rhinorrhea, sinus pressure, sneezing, sore throat and trouble swallowing.   Eyes: Negative for photophobia and visual disturbance.  Respiratory: Negative for cough, chest tightness, shortness of breath, wheezing and stridor.   Cardiovascular: Negative for chest pain, palpitations and leg swelling.  Gastrointestinal: Positive for abdominal pain. Negative for abdominal distention, anal bleeding, blood in stool, constipation, diarrhea, nausea and vomiting.  Genitourinary: Positive for flank pain. Negative for difficulty urinating, dysuria and hematuria.  Musculoskeletal: Positive for back pain. Negative for arthralgias, gait problem, joint swelling and myalgias.  Skin: Negative for color change, pallor, rash and wound.  Neurological: Positive for weakness and numbness. Negative for dizziness, tremors and  light-headedness.  Hematological: Negative for adenopathy. Does not bruise/bleed easily.  Psychiatric/Behavioral: Negative for agitation, behavioral problems, confusion, decreased concentration, dysphoric mood and sleep disturbance.       Objective:   Physical Exam Constitutional:      General: He is not in acute distress.    Appearance: Normal appearance. He is well-developed. He is obese. He is not ill-appearing, toxic-appearing or diaphoretic.  HENT:     Head: Normocephalic and atraumatic.     Right Ear: External ear normal.     Left Ear: External ear normal.     Nose: Nose normal.     Mouth/Throat:     Mouth: Mucous membranes are dry.     Pharynx: Oropharynx is clear. No oropharyngeal exudate or posterior oropharyngeal erythema.  Eyes:     General: No scleral icterus.    Extraocular Movements: Extraocular movements intact.  Conjunctiva/sclera: Conjunctivae normal.  Neck:     Musculoskeletal: Normal range of motion and neck supple. No neck rigidity or muscular tenderness.  Cardiovascular:     Rate and Rhythm: Regular rhythm. Bradycardia present.     Heart sounds: No murmur. No friction rub. No gallop.   Pulmonary:     Effort: Pulmonary effort is normal. No respiratory distress.     Breath sounds: Normal breath sounds. No wheezing, rhonchi or rales.  Chest:     Chest wall: No tenderness.  Abdominal:     General: Abdomen is flat. Bowel sounds are normal. There is no distension.     Palpations: Abdomen is soft. There is no mass.     Tenderness: There is abdominal tenderness in the right upper quadrant. There is no guarding or rebound.     Hernia: No hernia is present.  Musculoskeletal: Normal range of motion.        General: No tenderness.     Right hip: He exhibits normal range of motion.     Left hip: Normal.     Comments: Pain in his right groin with internal rotation of the hip joint  Skin:    General: Skin is warm and dry.     Coloration: Skin is not pale.      Findings: No erythema or rash.  Neurological:     Mental Status: He is alert and oriented to person, place, and time.           Assessment & Plan:   Constellation of symptoms that include back pain, abdominal pain chills numbness in his feet and also problems with handgrip as well as dental pain:  We will check a sed rate and C-reactive protein again.  Do not think that would explain his symptoms I think he clearly should have an HIV test as well as screening for viral hepatitides  Check a CMP and CBC with differential  CPK and an ANA  To get a Panorex of his teeth to look for dental infection but apparently [facility] not [facility].  Instead he is going to see his dentist to have his right lower tooth examined  Checking a set of blood cultures today from 2 sites  I will order MRI of the lumbar spine with and without contrast.  Seen by neurology in early February.  Plan on seeing him back roughly February 17.  If work-up so far is still unremarkable we can consider other imaging could include MRI of the cervical spine or mRCP though I would prefer GI to opine on need and yield for that  I am also hemoglobin A1c and B12 level given his possible neuropathy  I spent greater than 60 minutes with the patient including greater than 50% of time in face to face counsel of the patient and his wife regarding the work-up of fever of unknown origin though he does not tightly have a fever chills work-up of his abdominal pain back pain paresthesias and numbness weakened handgrip and in coordination of his care.

## 2019-01-12 ENCOUNTER — Ambulatory Visit: Payer: BLUE CROSS/BLUE SHIELD | Admitting: Family Medicine

## 2019-01-13 ENCOUNTER — Encounter: Payer: Self-pay | Admitting: Family Medicine

## 2019-01-13 ENCOUNTER — Ambulatory Visit: Payer: BLUE CROSS/BLUE SHIELD | Admitting: Family Medicine

## 2019-01-13 VITALS — BP 136/80 | HR 87 | Ht 67.0 in | Wt 199.2 lb

## 2019-01-13 DIAGNOSIS — F418 Other specified anxiety disorders: Secondary | ICD-10-CM

## 2019-01-13 DIAGNOSIS — L853 Xerosis cutis: Secondary | ICD-10-CM | POA: Diagnosis not present

## 2019-01-13 LAB — COMPLETE METABOLIC PANEL WITH GFR
AG Ratio: 1.6 (calc) (ref 1.0–2.5)
ALT: 36 U/L (ref 9–46)
AST: 27 U/L (ref 10–40)
Albumin: 4.6 g/dL (ref 3.6–5.1)
Alkaline phosphatase (APISO): 78 U/L (ref 40–115)
BUN: 9 mg/dL (ref 7–25)
CO2: 29 mmol/L (ref 20–32)
Calcium: 10 mg/dL (ref 8.6–10.3)
Chloride: 104 mmol/L (ref 98–110)
Creat: 0.96 mg/dL (ref 0.60–1.35)
GFR, EST AFRICAN AMERICAN: 111 mL/min/{1.73_m2} (ref >=60)
GFR, Est Non African American: 96 mL/min/{1.73_m2} (ref >=60)
GLUCOSE: 87 mg/dL (ref 65–99)
Globulin: 2.8 g/dL (calc) (ref 1.9–3.7)
Potassium: 4.4 mmol/L (ref 3.5–5.3)
Sodium: 140 mmol/L (ref 135–146)
Total Bilirubin: 0.7 mg/dL (ref 0.2–1.2)
Total Protein: 7.4 g/dL (ref 6.1–8.1)

## 2019-01-13 LAB — HEPATITIS B SURFACE ANTIGEN: Hepatitis B Surface Ag: NONREACTIVE

## 2019-01-13 LAB — CBC WITH DIFFERENTIAL/PLATELET
Absolute Monocytes: 711 cells/uL (ref 200–950)
Basophils Absolute: 62 cells/uL (ref 0–200)
Basophils Relative: 0.6 %
EOS PCT: 2.3 %
Eosinophils Absolute: 237 cells/uL (ref 15–500)
HCT: 50.4 % — ABNORMAL HIGH (ref 38.5–50.0)
Hemoglobin: 17.5 g/dL — ABNORMAL HIGH (ref 13.2–17.1)
LYMPHS ABS: 4614 {cells}/uL — AB (ref 850–3900)
MCH: 30.5 pg (ref 27.0–33.0)
MCHC: 34.7 g/dL (ref 32.0–36.0)
MCV: 87.8 fL (ref 80.0–100.0)
MPV: 10.7 fL (ref 7.5–12.5)
Monocytes Relative: 6.9 %
Neutro Abs: 4676 cells/uL (ref 1500–7800)
Neutrophils Relative %: 45.4 %
Platelets: 160 10*3/uL (ref 140–400)
RBC: 5.74 10*6/uL (ref 4.20–5.80)
RDW: 12.7 % (ref 11.0–15.0)
Total Lymphocyte: 44.8 %
WBC: 10.3 10*3/uL (ref 3.8–10.8)

## 2019-01-13 LAB — B12 AND FOLATE PANEL
Folate: 21 ng/mL
Vitamin B-12: 1307 pg/mL — ABNORMAL HIGH (ref 200–1100)

## 2019-01-13 LAB — C-REACTIVE PROTEIN: CRP: 3.6 mg/L (ref <8.0)

## 2019-01-13 LAB — HEPATITIS C ANTIBODY
Hepatitis C Ab: NONREACTIVE
SIGNAL TO CUT-OFF: 0.01 (ref <1.00)

## 2019-01-13 LAB — HEMOGLOBIN A1C
HEMOGLOBIN A1C: 5 %{Hb} (ref <5.7)
MEAN PLASMA GLUCOSE: 97 (calc)
eAG (mmol/L): 5.4 (calc)

## 2019-01-13 LAB — CK: Total CK: 116 U/L (ref 44–196)

## 2019-01-13 LAB — SEDIMENTATION RATE: Sed Rate: 2 mm/h (ref 0–15)

## 2019-01-13 LAB — ANA: Anti Nuclear Antibody(ANA): NEGATIVE

## 2019-01-13 LAB — HIV ANTIBODY (ROUTINE TESTING W REFLEX): HIV 1&2 Ab, 4th Generation: NONREACTIVE

## 2019-01-13 LAB — HEPATITIS B SURFACE ANTIBODY,QUALITATIVE: Hep B S Ab: NONREACTIVE

## 2019-01-13 MED ORDER — ESCITALOPRAM OXALATE 10 MG PO TABS: 10.0000 mg | ORAL_TABLET | Freq: Every day | ORAL | 0 refills | Status: DC

## 2019-01-13 NOTE — Progress Notes (Signed)
Established Patient Office Visit  Subjective:  Patient ID: Jake Schultz, male    DOB: March 27, 1976  Age: 43 y.o. MRN: [id number]  CC:  Chief Complaint  Patient presents with  . Follow-up    HPI Jake Schultz presents for anxiety and depression.  He feels as though he is about the same.  At first he told me that he took the Lexapro for a few days and it made him drowsy and he stopped it.  He had not tried to take it at nighttime.  He told me that Xanax had worked better for him in the past and wondered if we could use that instead.  Later in the interview he said that he had taken the Lexapro for a few weeks.  I advised that the Xanax would not be the best treatment for him and his problems in the long-term.  He was seen in the emergency room on the 10th of this month with right mid to lower abdominal pain.  CT scan taken at that time was negative.  He did see infectious disease on the 15th where an extensive work-up was begun for his symptoms.  Review of the laboratory taken at that time was normal or negative.  Results of blood cultures are still pending.  He has an appointment with neurologist on the 15th.  He tells now of dryness and flaking in the skin.  He uses caress soap and takes up to 15-minute showers.  Past Medical History:  Diagnosis Date  . CAD (coronary artery disease)   . Chills 01/11/2019  . Numbness and tingling of both feet 01/11/2019  . Pain, dental 01/11/2019  . Weakness of both hands 01/11/2019    Past Surgical History:  Procedure Laterality Date  . heart stent    . LAPAROSCOPIC APPENDECTOMY N/A 11/26/2018   Procedure: APPENDECTOMY LAPAROSCOPIC;  Surgeon: Jimmye Norman, MD;  Location: [facility] OR;  Service: General;  Laterality: N/A;    Family History  Problem Relation Age of Onset  . Hypertension Mother   . Hypertension Father   . Diabetes Mellitus II Father     Social History   Socioeconomic History  . Marital status: Married    Spouse name: Not on file  . Number  of children: Not on file  . Years of education: Not on file  . Highest education level: Not on file  Occupational History  . Not on file  Social Needs  . Financial resource strain: Not on file  . Food insecurity:    Worry: Not on file    Inability: Not on file  . Transportation needs:    Medical: Not on file    Non-medical: Not on file  Tobacco Use  . Smoking status: Never Smoker  . Smokeless tobacco: Never Used  Substance and Sexual Activity  . Alcohol use: Yes    Comment: has 3 beers in a given setting on the weekends  . Drug use: Never  . Sexual activity: Not on file  Lifestyle  . Physical activity:    Days per week: Not on file    Minutes per session: Not on file  . Stress: Not on file  Relationships  . Social connections:    Talks on phone: Not on file    Gets together: Not on file    Attends religious service: Not on file    Active member of club or organization: Not on file    Attends meetings of clubs or organizations: Not on file  Relationship status: Not on file  . Intimate partner violence:    Fear of current or ex partner: Not on file    Emotionally abused: Not on file    Physically abused: Not on file    Forced sexual activity: Not on file  Other Topics Concern  . Not on file  Social History Narrative  . Not on file    Outpatient Medications Prior to Visit  Medication Sig Dispense Refill  . atorvastatin (LIPITOR) 20 MG tablet Take 1 tablet (20 mg total) by mouth daily. 90 tablet 3  . omeprazole (PRILOSEC) 40 MG capsule Take 1 capsule (40 mg total) by mouth daily. 30 capsule 3  . escitalopram (LEXAPRO) 10 MG tablet Take 1 tablet (10 mg total) by mouth daily. 30 tablet 0   No facility-administered medications prior to visit.     Allergies  Allergen Reactions  . Aspirin Other (See Comments)    Causes stomach issues  . Plavix [Clopidogrel Bisulfate] Other (See Comments)    Causes stomach problems    ROS Review of Systems  Constitutional:  Negative for chills, diaphoresis, fatigue, fever and unexpected weight change.  HENT: Negative.   Respiratory: Negative.   Cardiovascular: Negative.   Skin: Negative for color change and wound.  Allergic/Immunologic: Negative for immunocompromised state.  Neurological: Positive for numbness.  Hematological: Does not bruise/bleed easily.  Psychiatric/Behavioral: Positive for dysphoric mood. The patient is nervous/anxious.       Objective:    Physical Exam  BP 136/80   Pulse 87   Ht 5\' 7"  (1.702 m)   Wt 199 lb 4 oz (90.4 kg)   SpO2 96%   BMI 31.21 kg/m  Wt Readings from Last 3 Encounters:  01/13/19 199 lb 4 oz (90.4 kg)  01/11/19 201 lb (91.2 kg)  12/16/18 196 lb (88.9 kg)   BP Readings from Last 3 Encounters:  01/13/19 136/80  01/11/19 127/88  01/06/19 113/83   Guideline developer:  UpToDate (see UpToDate for funding source) Date Released: June 2014  Health Maintenance Due  Topic Date Due  . [name]  11/25/1993  . INFLUENZA VACCINE  07/28/2018    There are no preventive care reminders to display for this patient.  Lab Results  Component Value Date   TSH 3.10 12/08/2018   Lab Results  Component Value Date   WBC 10.3 01/11/2019   HGB 17.5 (H) 01/11/2019   HCT 50.4 (H) 01/11/2019   MCV 87.8 01/11/2019   PLT 160 01/11/2019   Lab Results  Component Value Date   NA 140 01/11/2019   K 4.4 01/11/2019   CO2 29 01/11/2019   GLUCOSE 87 01/11/2019   BUN 9 01/11/2019   CREATININE 0.96 01/11/2019   BILITOT 0.7 01/11/2019   ALKPHOS 63 01/05/2019   AST 27 01/11/2019   ALT 36 01/11/2019   PROT 7.4 01/11/2019   ALBUMIN 3.9 01/05/2019   CALCIUM 10.0 01/11/2019   ANIONGAP 8 01/05/2019   GFR 97.41 12/08/2018   Lab Results  Component Value Date   CHOL 209 (H) 12/08/2018   Lab Results  Component Value Date   HDL 47.60 12/08/2018   Lab Results  Component Value Date   LDLCALC 139 (H) 12/08/2018   Lab Results  Component Value Date   TRIG 114.0  12/08/2018   Lab Results  Component Value Date   CHOLHDL 4 12/08/2018   Lab Results  Component Value Date   HGBA1C 5.0 01/11/2019      Assessment & Plan:  Problem List Items Addressed This Visit      Musculoskeletal and Integument   Xerosis cutis     Other   Depression with anxiety - Primary   Relevant Medications   escitalopram (LEXAPRO) 10 MG tablet   Other Relevant Orders   Ambulatory referral to Psychology      Meds ordered this encounter  Medications  . escitalopram (LEXAPRO) 10 MG tablet    Sig: Take 1 tablet (10 mg total) by mouth daily.    Dispense:  30 tablet    Refill:  0    Follow-up: Return in about 1 month (around 02/13/2019).   Patient will take the Lexapro in the evening and take advantage of the drowsiness that it causes for him.  Have referred him on for talking therapy as well.  He has follow-up scheduled for ID in an upcoming appointment with neurology.  Will appreciate their input regarding his constellation of symptoms.  Will consider GI referral as well.  Advised him to avoid long hot showers and to use a moisturizer directly after showering and drying himself with a towel.

## 2019-01-17 LAB — CULTURE, BLOOD (SINGLE): MICRO NUMBER:: 59042

## 2019-01-19 LAB — CULTURE, BLOOD (SINGLE): MICRO NUMBER:: 59043

## 2019-02-02 ENCOUNTER — Ambulatory Visit: Payer: BLUE CROSS/BLUE SHIELD | Admitting: Neurology

## 2019-02-15 DIAGNOSIS — D72820 Lymphocytosis (symptomatic): Secondary | ICD-10-CM | POA: Insufficient documentation

## 2019-02-15 DIAGNOSIS — D751 Secondary polycythemia: Secondary | ICD-10-CM | POA: Insufficient documentation

## 2019-02-15 NOTE — Assessment & Plan Note (Addendum)
Repeat CBC with diff today and check peripheral smear.  Referral to hematology for ongoing evaluation if this remains elevated.

## 2019-02-15 NOTE — Assessment & Plan Note (Addendum)
Will repeat differential today and check blood smear along with EPO level.  Discussed referral to hematology if this continues to return abnormal.
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
## 2019-02-16 ENCOUNTER — Ambulatory Visit (INDEPENDENT_AMBULATORY_CARE_PROVIDER_SITE_OTHER): Payer: BLUE CROSS/BLUE SHIELD | Admitting: Infectious Diseases

## 2019-02-16 ENCOUNTER — Encounter: Payer: Self-pay | Admitting: Infectious Diseases

## 2019-02-16 VITALS — BP 149/96 | HR 80 | Temp 98.5°F | Wt 202.0 lb

## 2019-02-16 DIAGNOSIS — R200 Anesthesia of skin: Secondary | ICD-10-CM | POA: Diagnosis not present

## 2019-02-16 DIAGNOSIS — R202 Paresthesia of skin: Secondary | ICD-10-CM

## 2019-02-16 DIAGNOSIS — R6883 Chills (without fever): Secondary | ICD-10-CM

## 2019-02-16 DIAGNOSIS — D751 Secondary polycythemia: Secondary | ICD-10-CM

## 2019-02-16 DIAGNOSIS — D72820 Lymphocytosis (symptomatic): Secondary | ICD-10-CM

## 2019-02-16 NOTE — Assessment & Plan Note (Signed)
Vitamin levels do not explain. MRI of spine pending tomorrow - will look out for results.

## 2019-02-16 NOTE — Assessment & Plan Note (Signed)
If this is polycythemia vera ?if what he is experiencing is erythromelalgia - he does describe a reddish discoloration to his feet when this happens.

## 2019-02-16 NOTE — Assessment & Plan Note (Signed)
No measurable temperatures during these episodes. Encouraged to continue to monitor.

## 2019-02-17 ENCOUNTER — Ambulatory Visit (HOSPITAL_COMMUNITY)
Admission: RE | Admit: 2019-02-17 | Discharge: 2019-02-17 | Disposition: A | Discharge status: Home / Self Care | Payer: BLUE CROSS/BLUE SHIELD | Source: Ambulatory Visit | Attending: Infectious Disease | Admitting: Infectious Disease

## 2019-02-17 DIAGNOSIS — R29898 Other symptoms and signs involving the musculoskeletal system: Secondary | ICD-10-CM | POA: Diagnosis present

## 2019-02-17 DIAGNOSIS — R200 Anesthesia of skin: Secondary | ICD-10-CM | POA: Diagnosis present

## 2019-02-17 DIAGNOSIS — R6883 Chills (without fever): Secondary | ICD-10-CM | POA: Diagnosis present

## 2019-02-17 DIAGNOSIS — K0889 Other specified disorders of teeth and supporting structures: Secondary | ICD-10-CM | POA: Insufficient documentation

## 2019-02-17 DIAGNOSIS — R202 Paresthesia of skin: Secondary | ICD-10-CM | POA: Diagnosis not present

## 2019-02-17 MED ORDER — GADOBUTROL 1 MMOL/ML IV SOLN
9.0000 mL | Freq: Once | INTRAVENOUS | Status: AC | PRN
  Administered 2019-02-17: 9 mL via INTRAVENOUS

## 2019-02-20 ENCOUNTER — Telehealth: Payer: Self-pay

## 2019-02-20 LAB — CBC WITH DIFFERENTIAL/PLATELET
Absolute Monocytes: 630 cells/uL (ref 200–950)
BASOS PCT: 0.8 %
Basophils Absolute: 72 cells/uL (ref 0–200)
Eosinophils Absolute: 207 cells/uL (ref 15–500)
Eosinophils Relative: 2.3 %
HCT: 48.3 % (ref 38.5–50.0)
Hemoglobin: 17 g/dL (ref 13.2–17.1)
Lymphs Abs: 3933 cells/uL — ABNORMAL HIGH (ref 850–3900)
MCH: 30.7 pg (ref 27.0–33.0)
MCHC: 35.2 g/dL (ref 32.0–36.0)
MCV: 87.2 fL (ref 80.0–100.0)
MPV: 11.2 fL (ref 7.5–12.5)
Monocytes Relative: 7 %
NEUTROS PCT: 46.2 %
Neutro Abs: 4158 cells/uL (ref 1500–7800)
PLATELETS: 213 10*3/uL (ref 140–400)
RBC: 5.54 10*6/uL (ref 4.20–5.80)
RDW: 12.3 % (ref 11.0–15.0)
TOTAL LYMPHOCYTE: 43.7 %
WBC: 9 10*3/uL (ref 3.8–10.8)

## 2019-02-20 LAB — ERYTHROPOIETIN: Erythropoietin: 6.9 m[IU]/mL (ref 2.6–18.5)

## 2019-02-20 LAB — PATHOLOGIST SMEAR REVIEW

## 2019-02-20 LAB — HIV ANTIBODY (ROUTINE TESTING W REFLEX): HIV 1&2 Ab, 4th Generation: NONREACTIVE

## 2019-02-20 NOTE — Telephone Encounter (Signed)
LPN left patient VM with results.  Valarie Cones, LPN

## 2019-02-20 NOTE — Telephone Encounter (Signed)
Patient returned call and was given response from provider. He then asked since there is not infection in his spine if maybe there was in his muscle as he is still having pain. He also wanted to know what his labs reported and was advised that not all labs have resulted so not able to report on those. He then asked what the next steps were because he "knows something is wrong with him". Advised will ask the provider and give him a call back.

## 2019-02-20 NOTE — Telephone Encounter (Signed)
-----   Message from Randall Hiss, MD sent at 02/20/2019  8:35 AM EST ----- He has some mild foraminal narrrowig in spine but NO evidence of infection

## 2019-02-21 NOTE — Telephone Encounter (Signed)
Patient called office to follow-up on message from 2/24. Patient would also to someone to review lab results from 2/20. Will route to MD to review labs and MRI for patient.  [name], [place]

## 2019-02-22 ENCOUNTER — Telehealth: Payer: Self-pay

## 2019-02-22 DIAGNOSIS — R1030 Lower abdominal pain, unspecified: Secondary | ICD-10-CM

## 2019-02-22 NOTE — Telephone Encounter (Signed)
Referral placed, they will contact patient to schedule the appointment. Patient is aware.

## 2019-02-22 NOTE — Telephone Encounter (Signed)
Patient called to follow-up on lab work done on 2/20. Patient would like to know if there are any abnormalities with Pathologist smear and Erythropoietin. Patient states that his hands and feet are red and shaky. Patient would like to know if there is any infection present in lab work. Will route message to Rexene Alberts, Np to advise on labs from 2/20. [name], [place]

## 2019-02-22 NOTE — Telephone Encounter (Signed)
Yes

## 2019-02-22 NOTE — Telephone Encounter (Signed)
Referral okay?    Copied from CRM [id number]. Topic: Referral - Request for Referral >> Feb 22, 2019 11:37 AM Jake Schultz wrote: Has patient seen PCP for this complaint? yes *If NO, is insurance requiring patient see PCP for this issue before PCP can refer them? Referral for which specialty: urologist Preferred provider/office: no preference Reason for referral: groin pain  Pt can be reached at [phone]

## 2019-02-22 NOTE — Addendum Note (Signed)
Addended by: Kathreen Devoid on: 02/22/2019 01:15 PM   Modules accepted: Orders

## 2019-02-22 NOTE — Telephone Encounter (Signed)
Error

## 2019-02-22 NOTE — Telephone Encounter (Signed)
I can find nothing abnormal in any of the labs that I ordered on him.

## 2019-02-24 NOTE — Telephone Encounter (Signed)
Please Give Jake Schultz my apologies as I have not been able to contact him back - I will call and go over personally all results with him on Monday around 12:00 if he can be available.  In brief his erythropoietin was normal.  His lumbar spine indicated no infection at all (so if he wants to still run that marathon we talked about he can). His hemoglobin is high normal again.  Thank you for reaching back out to him to let him know these updates.

## 2019-02-27 ENCOUNTER — Telehealth: Payer: Self-pay | Admitting: Infectious Diseases

## 2019-02-27 NOTE — Telephone Encounter (Addendum)
Called to go over blood tests with Jake Schultz and MRI results. No signs of any infection on his lumbar spine MRI. He does have some very slight/mild disk protrusion but this I doubt is contributing to his symptoms. We were not able to replicate his polycythemia (hgb 17) but he has a history of intermittent elevations in the past with associated absolute lymphocytosis. Smear of peripheral blood favors a reactive process and recommended that if the absolute lymphocytosis persists he should undergo immunophenotyping by flow cytometry for further evaluation. His erythropoietin level is normal.   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 I don't think there is a unifying diagnosis between this bilateral hand/feet symptoms and the right groin pain.   Kellie is understandably frustrated. He tells me that has "good days and bad days." Mostly pain in the front groin still and he would like to see a urologist. He has discussed with his PCP Dr. Doreene Burke and currently awaiting evaluation. He feels it is something in the groin that happened during surgery to remove appendix. he abdominal pain/strain feeling that runs and shoots down to testicle.  He said he had a scrotal ultrasound that was unremarkable. I reviewed his previous CT scans over the phone with him again today and nothing to explain symptoms. He is also working on neurology referral. He would like to get back to work and feel better.   There is nothing indicative of infection contributing at this time based on the blood work/diagnostics assessed by Dr. Daiva Eves and myself. No evidence of autoimmune  process contributing (inflammatory markers negative, ANA and CK negative). I encouraged him to continue to monitor for symptoms that would warrant re-evaluation by Dr. Daiva Eves.   He thanked me for my time and will contact our clinic if he has any new developments.   Rexene Alberts, MSN, NP-C [facility] for Infectious Disease [facility]  [email or web address] Pager: [phone] Office: [phone]

## 2019-02-28 ENCOUNTER — Emergency Department (HOSPITAL_COMMUNITY): Payer: BLUE CROSS/BLUE SHIELD

## 2019-02-28 ENCOUNTER — Emergency Department (HOSPITAL_COMMUNITY)
Admission: EM | Admit: 2019-02-28 | Discharge: 2019-02-28 | Disposition: A | Discharge status: Home / Self Care | Payer: BLUE CROSS/BLUE SHIELD | Attending: Emergency Medicine | Admitting: Emergency Medicine

## 2019-02-28 ENCOUNTER — Other Ambulatory Visit: Payer: Self-pay

## 2019-02-28 DIAGNOSIS — R072 Precordial pain: Secondary | ICD-10-CM | POA: Insufficient documentation

## 2019-02-28 DIAGNOSIS — I2510 Atherosclerotic heart disease of native coronary artery without angina pectoris: Secondary | ICD-10-CM | POA: Diagnosis not present

## 2019-02-28 DIAGNOSIS — Z79899 Other long term (current) drug therapy: Secondary | ICD-10-CM | POA: Diagnosis not present

## 2019-02-28 DIAGNOSIS — R079 Chest pain, unspecified: Secondary | ICD-10-CM | POA: Diagnosis present

## 2019-02-28 LAB — CBC
HCT: 46.2 % (ref 39.0–52.0)
Hemoglobin: 16.4 g/dL (ref 13.0–17.0)
MCH: 30.4 pg (ref 26.0–34.0)
MCHC: 35.5 g/dL (ref 30.0–36.0)
MCV: 85.7 fL (ref 80.0–100.0)
Platelets: 188 10*3/uL (ref 150–400)
RBC: 5.39 MIL/uL (ref 4.22–5.81)
RDW: 11.9 % (ref 11.5–15.5)
WBC: 10.4 10*3/uL (ref 4.0–10.5)
nRBC: 0 % (ref 0.0–0.2)

## 2019-02-28 LAB — I-STAT TROPONIN, ED: TROPONIN I, POC: 0 ng/mL (ref 0.00–0.08)

## 2019-02-28 LAB — BASIC METABOLIC PANEL
Anion gap: 8 (ref 5–15)
BUN: 10 mg/dL (ref 6–20)
CO2: 25 mmol/L (ref 22–32)
Calcium: 9.5 mg/dL (ref 8.9–10.3)
Chloride: 104 mmol/L (ref 98–111)
Creatinine, Ser: 1.04 mg/dL (ref 0.61–1.24)
GFR calc Af Amer: >60 mL/min (ref >60)
Glucose, Bld: 120 mg/dL — ABNORMAL HIGH (ref 70–99)
Potassium: 3.7 mmol/L (ref 3.5–5.1)
Sodium: 137 mmol/L (ref 135–145)

## 2019-02-28 MED ORDER — SODIUM CHLORIDE 0.9% FLUSH: 3.0000 mL | Freq: Once | INTRAVENOUS | Status: DC

## 2019-02-28 MED ORDER — ALUM & MAG HYDROXIDE-SIMETH 200-200-20 MG/5ML PO SUSP
15.0000 mL | Freq: Once | ORAL | Status: AC
  Administered 2019-02-28: 15 mL via ORAL
  Filled 2019-02-28: qty 30

## 2019-02-28 MED ORDER — ACETAMINOPHEN 500 MG PO TABS
1000.0000 mg | ORAL_TABLET | Freq: Once | ORAL | Status: AC
  Administered 2019-02-28: 1000 mg via ORAL
  Filled 2019-02-28: qty 2

## 2019-02-28 MED ORDER — FAMOTIDINE 20 MG PO TABS
20.0000 mg | ORAL_TABLET | Freq: Once | ORAL | Status: AC
  Administered 2019-02-28: 20 mg via ORAL
  Filled 2019-02-28: qty 1

## 2019-02-28 NOTE — ED Notes (Signed)
Patient verbalizes understanding of discharge instructions. Opportunity for questioning and answers were provided. Pt discharged from ED. 

## 2019-02-28 NOTE — ED Notes (Signed)
Pt denies wanting a cxr at this time. States he had a MRI last week and doesn't want the radiation unless bloodwork warrants.

## 2019-02-28 NOTE — Discharge Instructions (Signed)
It was our pleasure to provide your ER care today - we hope that you feel better.  Your ecg, chest xray, and troponin level are good.  You may try acetaminophen and/or ibuprofen as need.   If gi/reflux symptoms, try taking pepcid and maalox for symptom relief.  Follow up with primary care doctor in the coming week.  For chest discomfort, follow up with cardiologist in 1 week.  Return to ER if worse, new symptoms, increased trouble breathing, persistent/recurrent chest pain, other concern.

## 2019-02-28 NOTE — ED Triage Notes (Addendum)
To ED for eval left side cp for past few days. States he has had stent placed. States he does walk regularly for exercise up and down hills and hasn't had pain with walking. Complains of bp 'climbing' and not sleeping well. No nausea or vomiting. Describes this pain as a tightness and pain increases with 'pressing on chest'. Took 3 baby asa pta

## 2019-02-28 NOTE — ED Provider Notes (Signed)
[facility] EMERGENCY DEPARTMENT Provider Note   CSN: [id number] Arrival date & time: 02/28/19  0515    History   Chief Complaint Chief Complaint  Patient presents with  . Chest Pain    HPI Jake Schultz is a 43 y.o. male.     Patient c/o mid chest pain for past 2-3 days. At rest. Constant. Dull. Non radiating. Patient indicates occurs at rest. States when walking, exercising he does not notice any pain. No associated sob, nv or diaphoresis. Pain is not pleuritic. Indicates unlike symptoms prior to remote hx stent. Some indigestion/heartburn. Denies chest wall injury or strain. No back or flank pain. Denies leg pain or swelling. No recent surgery, trauma, immobility/travel. No fevers. Denies cough or uri symptoms. Has not taken any meds for symptom relief/pain.   The history is provided by the patient.  Chest Pain  Associated symptoms: no abdominal pain, no back pain, no cough, no fever, no headache, no nausea, no palpitations, no shortness of breath and no vomiting     Past Medical History:  Diagnosis Date  . CAD (coronary artery disease)   . Chills 01/11/2019  . Numbness and tingling of both feet 01/11/2019  . Pain, dental 01/11/2019  . Weakness of both hands 01/11/2019    Patient Active Problem List   Diagnosis Date Noted  . Lymphocytosis 02/15/2019  . Polycythemia 02/15/2019  . Xerosis cutis 01/13/2019  . Numbness and tingling of both feet 01/11/2019  . Weakness of both hands 01/11/2019  . Chills 01/11/2019  . Pain, dental 01/11/2019  . Depression with anxiety 12/12/2018  . Coronary artery disease due to lipid rich plaque 12/06/2018  . Chest pain 12/06/2018  . Paresthesias 12/06/2018  . Gastroesophageal reflux disease 12/06/2018  . Appendicitis 11/25/2018    Past Surgical History:  Procedure Laterality Date  . heart stent    . LAPAROSCOPIC APPENDECTOMY N/A 11/26/2018   Procedure: APPENDECTOMY LAPAROSCOPIC;  Surgeon: Jimmye Norman, MD;  Location:  [facility] OR;  Service: General;  Laterality: N/A;        Home Medications    Prior to Admission medications   Medication Sig Start Date End Date Taking? Authorizing Provider  atorvastatin (LIPITOR) 20 MG tablet Take 1 tablet (20 mg total) by mouth daily. 12/08/18   Mliss Sax, MD  escitalopram (LEXAPRO) 10 MG tablet Take 1 tablet (10 mg total) by mouth daily. 01/13/19   Mliss Sax, MD  omeprazole (PRILOSEC) 40 MG capsule Take 1 capsule (40 mg total) by mouth daily. 12/06/18   Mliss Sax, MD    Family History Family History  Problem Relation Age of Onset  . Hypertension Mother   . Hypertension Father   . Diabetes Mellitus II Father     Social History Social History   Tobacco Use  . Smoking status: Never Smoker  . Smokeless tobacco: Never Used  Substance Use Topics  . Alcohol use: Yes    Comment: has 3 beers in a given setting on the weekends  . Drug use: Never     Allergies   Aspirin and Plavix [clopidogrel bisulfate]   Review of Systems Review of Systems  Constitutional: Negative for fever.  HENT: Negative for sore throat.   Eyes: Negative for redness.  Respiratory: Negative for cough and shortness of breath.   Cardiovascular: Positive for chest pain. Negative for palpitations and leg swelling.  Gastrointestinal: Negative for abdominal pain, nausea and vomiting.  Genitourinary: Negative for flank pain.  Musculoskeletal: Negative for back  pain and neck pain.  Skin: Negative for rash.  Neurological: Negative for headaches.  Hematological: Does not bruise/bleed easily.  Psychiatric/Behavioral: Negative for confusion.     Physical Exam Updated Vital Signs BP (!) 146/99 (BP Location: Right Arm)   Pulse 80   Temp (!) 97.3 F (36.3 C) (Oral)   Resp 18   SpO2 99%   Physical Exam Vitals signs and nursing note reviewed.  Constitutional:      Appearance: Normal appearance. He is well-developed.  HENT:     Head: Atraumatic.      Nose: Nose normal.     Mouth/Throat:     Mouth: Mucous membranes are moist.     Pharynx: Oropharynx is clear.  Eyes:     General: No scleral icterus.    Conjunctiva/sclera: Conjunctivae normal.     Pupils: Pupils are equal, round, and reactive to light.  Neck:     Musculoskeletal: Normal range of motion and neck supple. No neck rigidity.     Trachea: No tracheal deviation.  Cardiovascular:     Rate and Rhythm: Normal rate and regular rhythm.     Pulses: Normal pulses.     Heart sounds: Normal heart sounds. No murmur. No friction rub. No gallop.   Pulmonary:     Effort: Pulmonary effort is normal. No accessory muscle usage or respiratory distress.     Breath sounds: Normal breath sounds.  Chest:     Chest wall: No tenderness.  Abdominal:     General: Bowel sounds are normal. There is no distension.     Palpations: Abdomen is soft.     Tenderness: There is no abdominal tenderness. There is no guarding.  Genitourinary:    Comments: No cva tenderness. Musculoskeletal:        General: No swelling or tenderness.     Right lower leg: No edema.     Left lower leg: No edema.  Skin:    General: Skin is warm and dry.     Findings: No rash.  Neurological:     Mental Status: He is alert.     Comments: Alert, speech clear.   Psychiatric:        Mood and Affect: Mood normal.      ED Treatments / Results  Labs (all labs ordered are listed, but only abnormal results are displayed) Results for orders placed or performed during the hospital encounter of 02/28/19  Basic metabolic panel  Result Value Ref Range   Sodium 137 135 - 145 mmol/L   Potassium 3.7 3.5 - 5.1 mmol/L   Chloride 104 98 - 111 mmol/L   CO2 25 22 - 32 mmol/L   Glucose, Bld 120 (H) 70 - 99 mg/dL   BUN 10 6 - 20 mg/dL   Creatinine, Ser 4.09 0.61 - 1.24 mg/dL   Calcium 9.5 8.9 - 81.1 mg/dL   GFR calc non Af Amer >60 >60 mL/min   GFR calc Af Amer >60 >60 mL/min   Anion gap 8 5 - 15  CBC  Result Value Ref Range    WBC 10.4 4.0 - 10.5 K/uL   RBC 5.39 4.22 - 5.81 MIL/uL   Hemoglobin 16.4 13.0 - 17.0 g/dL   HCT 91.4 78.2 - 95.6 %   MCV 85.7 80.0 - 100.0 fL   MCH 30.4 26.0 - 34.0 pg   MCHC 35.5 30.0 - 36.0 g/dL   RDW 21.3 08.6 - 57.8 %   Platelets 188 150 - 400 K/uL  nRBC 0.0 0.0 - 0.2 %  I-stat troponin, ED  Result Value Ref Range   Troponin i, poc 0.00 0.00 - 0.08 ng/mL   Comment 3           Mr Lumbar Spine W Wo Contrast  Result Date: 02/17/2019 CLINICAL DATA:  Right side low back and abdominal pain for 4 months. Left foot numbness. EXAM: MRI LUMBAR SPINE WITHOUT AND WITH CONTRAST TECHNIQUE: Multiplanar and multiecho pulse sequences of the lumbar spine were obtained without and with intravenous contrast. CONTRAST:  9 cc Gadavist IV. COMPARISON:  CT abdomen and pelvis 01/06/2019. FINDINGS: Segmentation:  Standard. Alignment:  Trace anterolisthesis L5 on S1 noted. Vertebrae: No fracture, evidence of discitis, or worrisome bone lesion. Hemangioma in L3 incidentally noted. Conus medullaris and cauda equina: Conus extends to the L1-2 level. Conus and cauda equina appear normal. Paraspinal and other soft tissues: Negative. Disc levels: T11-12 and T12-L1 are imaged in the sagittal plane only negative. L1-2: Negative. L2-3: Negative. L3-4: Negative. L4-5: There is a shallow broad-based central protrusion and mild facet degenerative change. The central canal and foramina are widely patent. L5-S1: Mild-to-moderate facet degenerative change and a very shallow disc bulge. No stenosis. IMPRESSION: Mild lumbar spondylosis at L4-5 and L5-S1 without central canal foraminal narrowing. Electronically Signed   By: Drusilla Kanner M.D.   On: 02/17/2019 16:00    EKG EKG Interpretation  Date/Time:  Tuesday February 28 2019 05:21:49 EST Ventricular Rate:  79 PR Interval:  138 QRS Duration: 108 QT Interval:  392 QTC Calculation: 449 R Axis:   81 Text Interpretation:  Normal sinus rhythm Incomplete right bundle branch block  No significant change since last tracing Confirmed by Cathren Laine ([id number]) on 02/28/2019 8:03:30 AM   Radiology Dg Chest 2 View  Result Date: 02/28/2019 CLINICAL DATA:  Left chest pain EXAM: CHEST - 2 VIEW COMPARISON:  11/29/2018 FINDINGS: Heart and mediastinal contours are within normal limits. No focal opacities or effusions. No acute bony abnormality. IMPRESSION: No active cardiopulmonary disease. Electronically Signed   By: Charlett Nose M.D.   On: 02/28/2019 09:03    Procedures Procedures (including critical care time)  Medications Ordered in ED Medications  sodium chloride flush (NS) 0.9 % injection 3 mL (has no administration in time range)  acetaminophen (TYLENOL) tablet 1,000 mg (1,000 mg Oral Given 02/28/19 0810)  famotidine (PEPCID) tablet 20 mg (20 mg Oral Given 02/28/19 0810)  alum & mag hydroxide-simeth (MAALOX/MYLANTA) 200-200-20 MG/5ML suspension 15 mL (15 mLs Oral Given 02/28/19 6213)     Initial Impression / Assessment and Plan / ED Course  I have reviewed the triage vital signs and the nursing notes.  Pertinent labs & imaging results that were available during my care of the patient were reviewed by me and considered in my medical decision making (see chart for details).  Labs and imaging. Ecg.  Reviewed nursing notes and prior charts for additional history.   Will try acetaminophen, pepcid and maalox for symptom relief.  After symptoms at rest, constant, for past 2-3 days, trop is normal - does not appear c/s ACS.   Labs reviewed - chem normal.  cxr reviewed - no pna.   Patient comfortable, no acute pain, no sob/increased wob - pt currently appears stable for d/c.   Return precautions provided.     Final Clinical Impressions(s) / ED Diagnoses   Final diagnoses:  None    ED Discharge Orders    None       Jakiya Bookbinder,  Caryn Bee, MD 02/28/19 [phone]

## 2019-02-28 NOTE — ED Notes (Signed)
Patient transported to X-ray 

## 2019-03-12 NOTE — Telephone Encounter (Signed)
Thanks so much Steph!

## 2019-03-21 ENCOUNTER — Encounter

## 2019-03-21 ENCOUNTER — Ambulatory Visit: Payer: BLUE CROSS/BLUE SHIELD | Admitting: Neurology

## 2019-04-26 ENCOUNTER — Telehealth: Payer: Self-pay | Admitting: Family Medicine

## 2019-04-26 NOTE — Telephone Encounter (Signed)
Called and left vm for patient. Calling to schedule virtual visit with Dr. Kremer.  °

## 2019-07-13 ENCOUNTER — Ambulatory Visit: Payer: BLUE CROSS/BLUE SHIELD | Admitting: Family Medicine

## 2019-07-13 ENCOUNTER — Other Ambulatory Visit: Payer: Self-pay

## 2019-07-14 ENCOUNTER — Encounter: Payer: Self-pay | Admitting: Family Medicine

## 2019-07-14 ENCOUNTER — Ambulatory Visit: Payer: BLUE CROSS/BLUE SHIELD | Admitting: Family Medicine

## 2019-07-14 VITALS — BP 138/98 | HR 94 | Ht 68.0 in | Wt 205.2 lb

## 2019-07-14 DIAGNOSIS — H938X2 Other specified disorders of left ear: Secondary | ICD-10-CM | POA: Diagnosis not present

## 2019-07-14 DIAGNOSIS — M26622 Arthralgia of left temporomandibular joint: Secondary | ICD-10-CM

## 2019-07-14 DIAGNOSIS — R030 Elevated blood-pressure reading, without diagnosis of hypertension: Secondary | ICD-10-CM | POA: Diagnosis not present

## 2019-07-14 MED ORDER — FLUTICASONE PROPIONATE 50 MCG/ACT NA SUSP: 2.0000 | Freq: Every day | NASAL | 6 refills | Status: AC

## 2019-07-14 NOTE — Progress Notes (Signed)
Established Patient Office Visit  Subjective:  Patient ID: Jake Schultz, male    DOB: 1976-08-18  Age: 43 y.o. MRN: [id number]  CC:  Chief Complaint  Patient presents with  . left ear sensitive    HPI Jake Schultz presents for evaluation of left ear discomfort and congestion.  He is sensed fullness in the ear.  He cannot really say that he has appreciated any postnasal drip and has not had nasal congestion or other allergy symptoms.  He feels lightheadedness without a spinning sensation when he leans forward sometimes.  He does have TMJ and that is been bothering him recently.  His dentist made him a new bite guard.  He does grind his teeth in the evening.  Patient self discontinued the atorvastatin as well as his Lexapro.  He no longer feels anxious or depressed.  His outlook on life is much proved.  He now has a job Environmental managermoving furniture in a factory.  Blood pressure has been running in the 130s over 80s.  Past Medical History:  Diagnosis Date  . CAD (coronary artery disease)   . Chills 01/11/2019  . Numbness and tingling of both feet 01/11/2019  . Pain, dental 01/11/2019  . Weakness of both hands 01/11/2019    Past Surgical History:  Procedure Laterality Date  . heart stent    . LAPAROSCOPIC APPENDECTOMY N/A 11/26/2018   Procedure: APPENDECTOMY LAPAROSCOPIC;  Surgeon: Jimmye NormanWyatt, James, MD;  [facility];  Service: General;  Laterality: N/A;    Family History  Problem Relation Age of Onset  . Hypertension Mother   . Hypertension Father   . Diabetes Mellitus II Father     Social History   Socioeconomic History  . Marital status: Married    Spouse name: Not on file  . Number of children: Not on file  . Years of education: Not on file  . Highest education level: Not on file  Occupational History  . Not on file  Social Needs  . Financial resource strain: Not on file  . Food insecurity    Worry: Not on file    Inability: Not on file  . Transportation needs    Medical: Not  on file    Non-medical: Not on file  Tobacco Use  . Smoking status: Never Smoker  . Smokeless tobacco: Never Used  Substance and Sexual Activity  . Alcohol use: Yes    Comment: has 3 beers in a given setting on the weekends  . Drug use: Never  . Sexual activity: Not on file  Lifestyle  . Physical activity    Days per week: Not on file    Minutes per session: Not on file  . Stress: Not on file  Relationships  . Social Musicianconnections    Talks on phone: Not on file    Gets together: Not on file    Attends religious service: Not on file    Active member of club or organization: Not on file    Attends meetings of clubs or organizations: Not on file    Relationship status: Not on file  . Intimate partner violence    Fear of current or ex partner: Not on file    Emotionally abused: Not on file    Physically abused: Not on file    Forced sexual activity: Not on file  Other Topics Concern  . Not on file  Social History Narrative  . Not on file    Outpatient Medications Prior to Visit  Medication Sig Dispense Refill  . atorvastatin (LIPITOR) 20 MG tablet Take 1 tablet (20 mg total) by mouth daily. 90 tablet 3  . escitalopram (LEXAPRO) 10 MG tablet Take 1 tablet (10 mg total) by mouth daily. 30 tablet 0  . Multiple Vitamin (MULTIVITAMIN) tablet Take 1 tablet by mouth daily.    Marland Kitchen. omeprazole (PRILOSEC) 40 MG capsule Take 1 capsule (40 mg total) by mouth daily. (Patient taking differently: Take 40 mg by mouth as needed (heartburn/acid reflux). ) 30 capsule 3   No facility-administered medications prior to visit.     Allergies  Allergen Reactions  . Aspirin Other (See Comments)    Causes stomach issues  . Plavix [Clopidogrel Bisulfate] Other (See Comments)    Causes stomach problems    ROS Review of Systems  Constitutional: Negative.   HENT: Positive for dental problem. Negative for congestion, ear pain, hearing loss, postnasal drip, rhinorrhea, sinus pain and sore throat.    Eyes: Negative for photophobia and visual disturbance.  Respiratory: Negative.   Cardiovascular: Negative.   Gastrointestinal: Negative.   Neurological: Positive for light-headedness. Negative for headaches.  Hematological: Negative.   Psychiatric/Behavioral: Negative.    Dep[facility] screen [facility] 07/14/2019 01/11/2019 12/12/2018  Decreased Interest 0 0 1  Down, Depressed, Hopeless 0 0 1  PHQ - 2 Score 0 0 2  Altered sleeping 0 - 1  Tired, decreased energy 0 - 2  Change in appetite 0 - 0  Feeling bad or failure about yourself  0 - 2  Trouble concentrating 0 - 1  Moving slowly or fidgety/restless 0 - 0  Suicidal thoughts 0 - 0  PHQ-9 Score 0 - 8      Objective:    Physical Exam  Constitutional: He is oriented to person, place, and time. He appears well-developed and well-nourished. No distress.  HENT:  Head: Normocephalic and atraumatic.  Right Ear: Tympanic membrane and external ear normal. No foreign bodies. Tympanic membrane is not injected, not erythematous and not retracted. No middle ear effusion.  Left Ear: Tympanic membrane and external ear normal. No foreign bodies. Tympanic membrane is not injected, not erythematous and not retracted.  No middle ear effusion.  Mouth/Throat: Oropharynx is clear and moist. No oropharyngeal exudate.  Eyes: Pupils are equal, round, and reactive to light. Conjunctivae and EOM are normal. Right eye exhibits no discharge. Left eye exhibits no discharge. No scleral icterus.  Neck: Neck supple. No JVD present. No tracheal deviation present. No thyromegaly present.  Cardiovascular: Normal rate, regular rhythm and normal heart sounds.  Pulmonary/Chest: Effort normal and breath sounds normal. No stridor.  Lymphadenopathy:    He has no cervical adenopathy.  Neurological: He is alert and oriented to person, place, and time.  Skin: Skin is warm and dry. He is not diaphoretic.  Psychiatric: He has a normal mood and affect. His behavior is normal.     BP (!) 138/98   Pulse 94   Ht 5\' 8"  (1.727 m)   Wt 205 lb 4 oz (93.1 kg)   SpO2 98%   BMI 31.21 kg/m  Wt Readings from Last 3 Encounters:  07/14/19 205 lb 4 oz (93.1 kg)  02/28/19 185 lb (83.9 kg)  02/16/19 202 lb (91.6 kg)   BP Readings from Last 3 Encounters:  07/14/19 (!) 138/98  02/28/19 120/81  02/16/19 (!) 149/96   Guideline developer:  UpToDate (see UpToDate for funding source) Date Released: June 2014  Health Maintenance Due  Topic Date Due  .  TETANUS/TDAP  11/25/1993    There are no preventive care reminders to display for this patient.  Lab Results  Component Value Date   TSH 3.10 12/08/2018   Lab Results  Component Value Date   WBC 10.4 02/28/2019   HGB 16.4 02/28/2019   HCT 46.2 02/28/2019   MCV 85.7 02/28/2019   PLT 188 02/28/2019   Lab Results  Component Value Date   NA 137 02/28/2019   K 3.7 02/28/2019   CO2 25 02/28/2019   GLUCOSE 120 (H) 02/28/2019   BUN 10 02/28/2019   CREATININE 1.04 02/28/2019   BILITOT 0.7 01/11/2019   ALKPHOS 63 01/05/2019   AST 27 01/11/2019   ALT 36 01/11/2019   PROT 7.4 01/11/2019   ALBUMIN 3.9 01/05/2019   CALCIUM 9.5 02/28/2019   ANIONGAP 8 02/28/2019   GFR 97.41 12/08/2018   Lab Results  Component Value Date   CHOL 209 (H) 12/08/2018   Lab Results  Component Value Date   HDL 47.60 12/08/2018   Lab Results  Component Value Date   LDLCALC 139 (H) 12/08/2018   Lab Results  Component Value Date   TRIG 114.0 12/08/2018   Lab Results  Component Value Date   CHOLHDL 4 12/08/2018   Lab Results  Component Value Date   HGBA1C 5.0 01/11/2019      Assessment & Plan:   Problem List Items Addressed This Visit      Musculoskeletal and Integument   Arthralgia of left temporomandibular joint - Primary     Other   Congestion of left ear   Relevant Medications   fluticasone (FLONASE) 50 MCG/ACT nasal spray   Elevated BP without diagnosis of hypertension      Meds ordered this encounter   Medications  . fluticasone (FLONASE) 50 MCG/ACT nasal spray    Sig: Place 2 sprays into both nostrils daily.    Dispense:  16 g    Refill:  6    Follow-up: Return in about 3 months (around 10/14/2019), or if symptoms worsen or fail to improve.   Patient was given information on TMJ syndrome and the Dash diet.  Will check his record his blood pressure over the next few months and follow-up.  Trial of Flonase to see if that will help his ear congestion and lightheadedness.  Recommend that that he uses mouthguard as directed by his dentist.  We will that the majority of his ear discomfort is  referred pain from the TMJ.

## 2019-07-14 NOTE — Patient Instructions (Addendum)
Temporomandibular Joint Syndrome  Temporomandibular joint syndrome (TMJ syndrome) is a condition that causes pain in the temporomandibular joints. These joints are located near your ears and allow your jaw to open and close. For people with TMJ syndrome, chewing, biting, or other movements of the jaw can be difficult or painful. TMJ syndrome is often mild and goes away within a few weeks. However, sometimes the condition becomes a long-term (chronic) problem. What are the causes? This condition may be caused by:  Grinding your teeth or clenching your jaw. Some people do this when they are under stress.  Arthritis.  Injury to the jaw.  Head or neck injury.  Teeth or dentures that are not aligned well. In some cases, the cause of TMJ syndrome may not be known. What are the signs or symptoms? The most common symptom of this condition is an aching pain on the side of the head in the area of the TMJ. Other symptoms may include:  Pain when moving your jaw, such as when chewing or biting.  Being unable to open your jaw all the way.  Making a clicking sound when you open your mouth.  Headache.  Earache.  Neck or shoulder pain. How is this diagnosed? This condition may be diagnosed based on:  Your symptoms and medical history.  A physical exam. Your health care provider may check the range of motion of your jaw.  Imaging tests, such as X-rays or an MRI. You may also need to see your dentist, who will determine if your teeth and jaw are lined up correctly. How is this treated? TMJ syndrome often goes away on its own. If treatment is needed, the options may include:  Eating soft foods and applying ice or heat.  Medicines to relieve pain or inflammation.  Medicines or massage to relax the muscles.  A splint, bite plate, or mouthpiece to prevent teeth grinding or jaw clenching.  Relaxation techniques or counseling to help reduce stress.  A therapy for pain in which an  electrical current is applied to the nerves through the skin (transcutaneous electrical nerve stimulation).  Acupuncture. This is sometimes helpful to relieve pain.  Jaw surgery. This is rarely needed. Follow these instructions at home:  Eating and drinking  Eat a soft diet if you are having trouble chewing.  Avoid foods that require a lot of chewing. Do not chew gum. General instructions  Take over-the-counter and prescription medicines only as told by your health care provider.  If directed, put ice on the painful area. ? Put ice in a plastic bag. ? Place a towel between your skin and the bag. ? Leave the ice on for 20 minutes, 2-3 times a day.  Apply a warm, wet cloth (warm compress) to the painful area as directed.  Massage your jaw area and do any jaw stretching exercises as told by your health care provider.  If you were given a splint, bite plate, or mouthpiece, wear it as told by your health care provider.  Keep all follow-up visits as told by your health care provider. This is important. Contact a health care provider if:  You are having trouble eating.  You have new or worsening symptoms. Get help right away if:  Your jaw locks open or closed. Summary  Temporomandibular joint syndrome (TMJ syndrome) is a condition that causes pain in the temporomandibular joints. These joints are located near your ears and allow your jaw to open and close.  TMJ syndrome is often mild and   goes away within a few weeks. However, sometimes the condition becomes a long-term (chronic) problem.  Symptoms include an aching pain on the side of the head in the area of the TMJ, pain when chewing or biting, and being unable to open your jaw all the way. You may also make a clicking sound when you open your mouth.  TMJ syndrome often goes away on its own. If treatment is needed, it may include medicines to relieve pain, reduce inflammation, or relax the muscles. A splint, bite plate, or  mouthpiece may also be used to prevent teeth grinding or jaw clenching. This information is not intended to replace advice given to you by your health care provider. Make sure you discuss any questions you have with your health care provider. Document Released: 09/08/2001 Document Revised: 02/25/2018 Document Reviewed: 01/25/2018 Elsevier Patient Education  2020 Elsevier Inc.  DASH Eating Plan DASH stands for "Dietary Approaches to Stop Hypertension." The DASH eating plan is a healthy eating plan that has been shown to reduce high blood pressure (hypertension). It may also reduce your risk for type 2 diabetes, heart disease, and stroke. The DASH eating plan may also help with weight loss. What are tips for following this plan?  General guidelines  Avoid eating more than 2,300 mg (milligrams) of salt (sodium) a day. If you have hypertension, you may need to reduce your sodium intake to 1,500 mg a day.  Limit alcohol intake to no more than 1 drink a day for nonpregnant women and 2 drinks a day for men. One drink equals 12 oz of beer, 5 oz of wine, or 1 oz of hard liquor.  Work with your health care provider to maintain a healthy body weight or to lose weight. Ask what an ideal weight is for you.  Get at least 30 minutes of exercise that causes your heart to beat faster (aerobic exercise) most days of the week. Activities may include walking, swimming, or biking.  Work with your health care provider or diet and nutrition specialist (dietitian) to adjust your eating plan to your individual calorie needs. Reading food labels   Check food labels for the amount of sodium per serving. Choose foods with less than 5 percent of the Daily Value of sodium. Generally, foods with less than 300 mg of sodium per serving fit into this eating plan.  To find whole grains, look for the word "whole" as the first word in the ingredient list. Shopping  Buy products labeled as "low-sodium" or "no salt added."   Buy fresh foods. Avoid canned foods and premade or frozen meals. Cooking  Avoid adding salt when cooking. Use salt-free seasonings or herbs instead of table salt or sea salt. Check with your health care provider or pharmacist before using salt substitutes.  Do not fry foods. Cook foods using healthy methods such as baking, boiling, grilling, and broiling instead.  Cook with heart-healthy oils, such as olive, canola, soybean, or sunflower oil. Meal planning  Eat a balanced diet that includes: ? 5 or more servings of fruits and vegetables each day. At each meal, try to fill half of your plate with fruits and vegetables. ? Up to 6-8 servings of whole grains each day. ? Less than 6 oz of lean meat, poultry, or fish each day. A 3-oz serving of meat is about the same size as a deck of cards. One egg equals 1 oz. ? 2 servings of low-fat dairy each day. ? A serving of nuts, seeds, or  beans 5 times each week. ? Heart-healthy fats. Healthy fats called Omega-3 fatty acids are found in foods such as flaxseeds and coldwater fish, like sardines, salmon, and mackerel.  Limit how much you eat of the following: ? Canned or prepackaged foods. ? Food that is high in trans fat, such as fried foods. ? Food that is high in saturated fat, such as fatty meat. ? Sweets, desserts, sugary drinks, and other foods with added sugar. ? Full-fat dairy products.  Do not salt foods before eating.  Try to eat at least 2 vegetarian meals each week.  Eat more home-cooked food and less restaurant, buffet, and fast food.  When eating at a restaurant, ask that your food be prepared with less salt or no salt, if possible. What foods are recommended? The items listed may not be a complete list. Talk with your dietitian about what dietary choices are best for you. Grains Whole-grain or whole-wheat bread. Whole-grain or whole-wheat pasta. Brown rice. [place]. Bulgur. Whole-grain and low-sodium cereals. Pita bread.  Low-fat, low-sodium crackers. Whole-wheat flour tortillas. Vegetables Fresh or frozen vegetables (raw, steamed, roasted, or grilled). Low-sodium or reduced-sodium tomato and vegetable juice. Low-sodium or reduced-sodium tomato sauce and tomato paste. Low-sodium or reduced-sodium canned vegetables. Fruits All fresh, dried, or frozen fruit. Canned fruit in natural juice (without added sugar). Meat and other protein foods Skinless chicken or [place]. Ground chicken or [place]. Pork with fat trimmed off. Fish and seafood. Egg whites. Dried beans, peas, or lentils. Unsalted nuts, nut butters, and seeds. Unsalted canned beans. Lean cuts of beef with fat trimmed off. Low-sodium, lean deli meat. Dairy Low-fat (1%) or fat-free (skim) milk. Fat-free, low-fat, or reduced-fat cheeses. Nonfat, low-sodium ricotta or cottage cheese. Low-fat or nonfat yogurt. Low-fat, low-sodium cheese. Fats and oils Soft margarine without trans fats. Vegetable oil. Low-fat, reduced-fat, or light mayonnaise and salad dressings (reduced-sodium). Canola, safflower, olive, soybean, and sunflower oils. Avocado. Seasoning and other foods Herbs. Spices. Seasoning mixes without salt. Unsalted popcorn and pretzels. Fat-free sweets. What foods are not recommended? The items listed may not be a complete list. Talk with your dietitian about what dietary choices are best for you. Grains Baked goods made with fat, such as croissants, muffins, or some breads. Dry pasta or rice meal packs. Vegetables Creamed or fried vegetables. Vegetables in a cheese sauce. Regular canned vegetables (not low-sodium or reduced-sodium). Regular canned tomato sauce and paste (not low-sodium or reduced-sodium). Regular tomato and vegetable juice (not low-sodium or reduced-sodium). Angie Fava. Olives. Fruits Canned fruit in a light or heavy syrup. Fried fruit. Fruit in cream or butter sauce. Meat and other protein foods Fatty cuts of meat. Ribs. Fried meat. Berniece Salines.  Sausage. Bologna and other processed lunch meats. Salami. Fatback. Hotdogs. Bratwurst. Salted nuts and seeds. Canned beans with added salt. Canned or smoked fish. Whole eggs or egg yolks. Chicken or [place] with skin. Dairy Whole or 2% milk, cream, and half-and-half. Whole or full-fat cream cheese. Whole-fat or sweetened yogurt. Full-fat cheese. Nondairy creamers. Whipped toppings. Processed cheese and cheese spreads. Fats and oils Butter. Stick margarine. Lard. Shortening. Ghee. Bacon fat. Tropical oils, such as coconut, palm kernel, or palm oil. Seasoning and other foods Salted popcorn and pretzels. Onion salt, garlic salt, seasoned salt, table salt, and sea salt. Worcestershire sauce. Tartar sauce. Barbecue sauce. Teriyaki sauce. Soy sauce, including reduced-sodium. Steak sauce. Canned and packaged gravies. Fish sauce. Oyster sauce. Cocktail sauce. Horseradish that you find on the shelf. Ketchup. Mustard. Meat flavorings and tenderizers. Bouillon  cubes. Hot sauce and Tabasco sauce. Premade or packaged marinades. Premade or packaged taco seasonings. Relishes. Regular salad dressings. Where to find more information:  National Heart, Lung, and Blood Institute: PopSteam.iswww.nhlbi.nih.gov  American Heart Association: www.heart.org Summary  The DASH eating plan is a healthy eating plan that has been shown to reduce high blood pressure (hypertension). It may also reduce your risk for type 2 diabetes, heart disease, and stroke.  With the DASH eating plan, you should limit salt (sodium) intake to 2,300 mg a day. If you have hypertension, you may need to reduce your sodium intake to 1,500 mg a day.  When on the DASH eating plan, aim to eat more fresh fruits and vegetables, whole grains, lean proteins, low-fat dairy, and heart-healthy fats.  Work with your health care provider or diet and nutrition specialist (dietitian) to adjust your eating plan to your individual calorie needs. This information is not intended  to replace advice given to you by your health care provider. Make sure you discuss any questions you have with your health care provider. Document Released: 12/03/2011 Document Revised: 11/26/2017 Document Reviewed: 12/07/2016 Elsevier Patient Education  2020 [employer]

## 2019-07-17 ENCOUNTER — Ambulatory Visit: Payer: BLUE CROSS/BLUE SHIELD | Admitting: Neurology

## 2019-07-17 ENCOUNTER — Ambulatory Visit: Payer: BLUE CROSS/BLUE SHIELD | Admitting: Family Medicine

## 2019-07-17 ENCOUNTER — Encounter

## 2019-10-01 IMAGING — CR DG CHEST 2V
2 series · 2 of 2 positions shown · non-contrast
Comparison: Radiographs November 29, 2017.

CLINICAL DATA: Chest pain, shortness of breath.

EXAM:
CHEST - 2 VIEW

[chest pa]
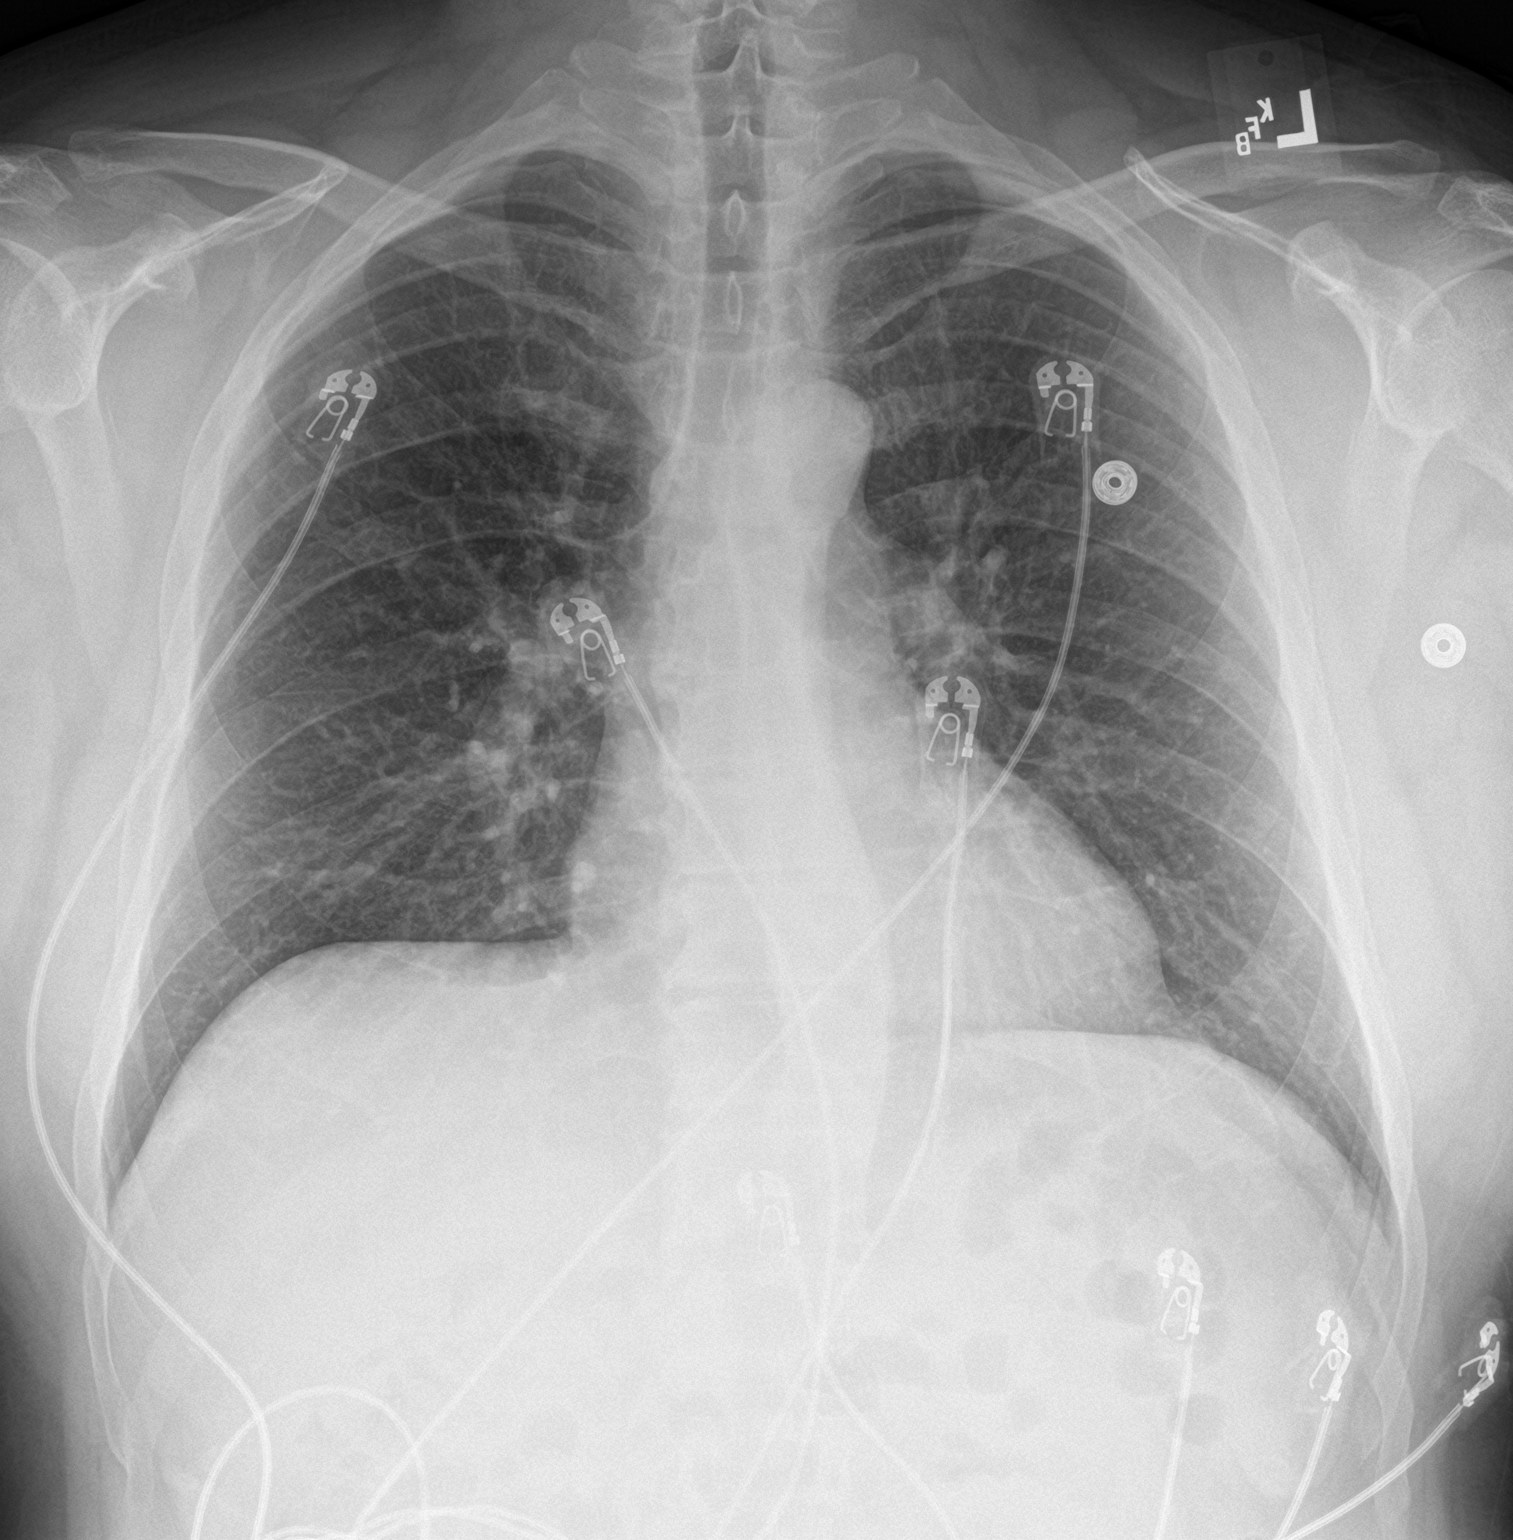

[chest lat]
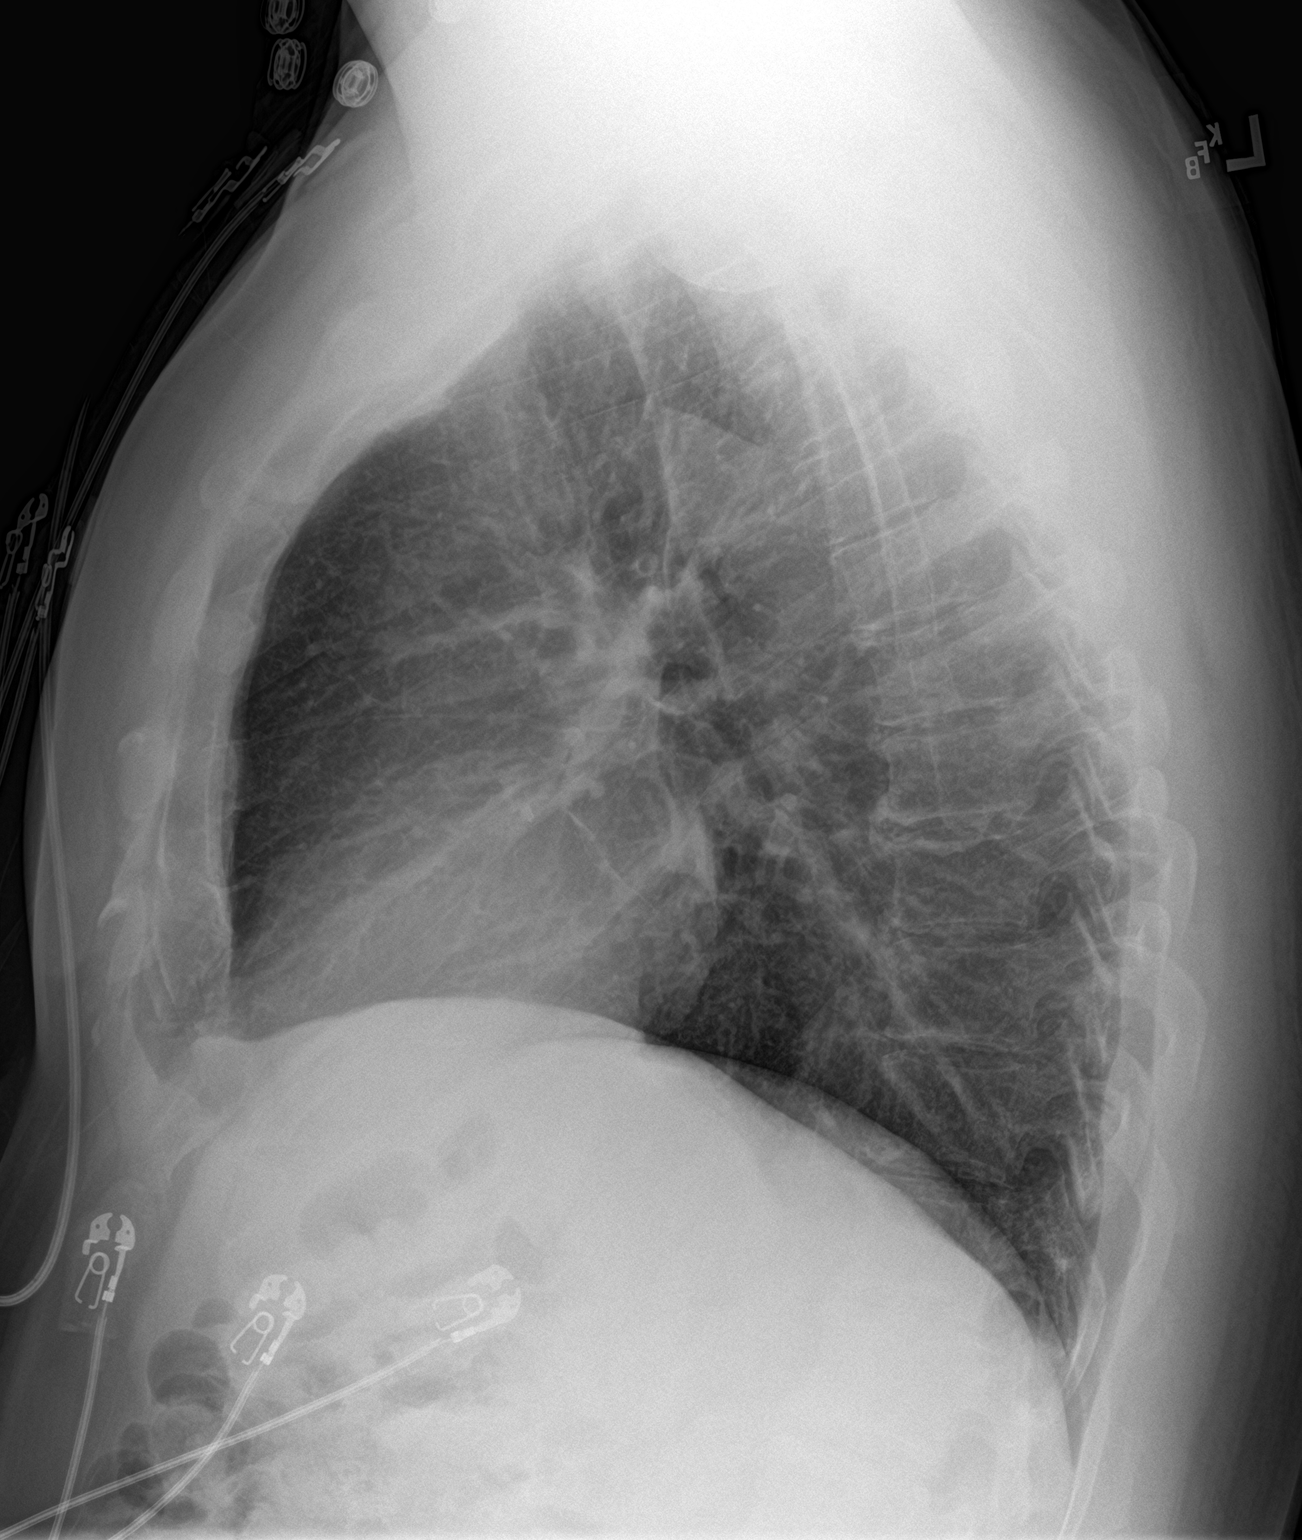

[2 of 2 positions shown; findings below may reference images not displayed]

FINDINGS: The heart size and mediastinal contours are within normal limits.
Both lungs are clear. No pneumothorax or pleural effusion is noted.
The visualized skeletal structures are unremarkable.
IMPRESSION: No active cardiopulmonary disease.

## 2019-10-06 ENCOUNTER — Other Ambulatory Visit: Payer: Self-pay

## 2019-10-06 ENCOUNTER — Encounter: Payer: Self-pay | Admitting: Family Medicine

## 2019-10-06 ENCOUNTER — Ambulatory Visit: Payer: BLUE CROSS/BLUE SHIELD | Admitting: Family Medicine

## 2019-10-06 VITALS — BP 128/80 | HR 82 | Ht 68.0 in | Wt 200.2 lb

## 2019-10-06 DIAGNOSIS — Z91199 Patient's noncompliance with other medical treatment and regimen due to unspecified reason: Secondary | ICD-10-CM | POA: Insufficient documentation

## 2019-10-06 DIAGNOSIS — I2510 Atherosclerotic heart disease of native coronary artery without angina pectoris: Secondary | ICD-10-CM | POA: Diagnosis not present

## 2019-10-06 DIAGNOSIS — I2583 Coronary atherosclerosis due to lipid rich plaque: Secondary | ICD-10-CM

## 2019-10-06 DIAGNOSIS — Z9119 Patient's noncompliance with other medical treatment and regimen: Secondary | ICD-10-CM | POA: Diagnosis not present

## 2019-10-06 DIAGNOSIS — E739 Lactose intolerance, unspecified: Secondary | ICD-10-CM | POA: Insufficient documentation

## 2019-10-06 DIAGNOSIS — K5901 Slow transit constipation: Secondary | ICD-10-CM | POA: Insufficient documentation

## 2019-10-06 DIAGNOSIS — F418 Other specified anxiety disorders: Secondary | ICD-10-CM | POA: Diagnosis not present

## 2019-10-06 DIAGNOSIS — K2960 Other gastritis without bleeding: Secondary | ICD-10-CM

## 2019-10-06 LAB — COMPREHENSIVE METABOLIC PANEL WITH GFR
ALT: 34 U/L (ref 0–53)
AST: 25 U/L (ref 0–37)
Albumin: 4.7 g/dL (ref 3.5–5.2)
Alkaline Phosphatase: 59 U/L (ref 39–117)
BUN: 13 mg/dL (ref 6–23)
CO2: 27 meq/L (ref 19–32)
Calcium: 9.9 mg/dL (ref 8.4–10.5)
Chloride: 102 meq/L (ref 96–112)
Creatinine, Ser: 0.97 mg/dL (ref 0.40–1.50)
GFR: 83.75 mL/min
Glucose, Bld: 92 mg/dL (ref 70–99)
Potassium: 4 meq/L (ref 3.5–5.1)
Sodium: 138 meq/L (ref 135–145)
Total Bilirubin: 0.9 mg/dL (ref 0.2–1.2)
Total Protein: 7.4 g/dL (ref 6.0–8.3)

## 2019-10-06 LAB — LIPID PANEL
Cholesterol: 207 mg/dL — ABNORMAL HIGH (ref 0–200)
HDL: 38.7 mg/dL — ABNORMAL LOW
LDL Cholesterol: 144 mg/dL — ABNORMAL HIGH (ref 0–99)
NonHDL: 168.24
Total CHOL/HDL Ratio: 5
Triglycerides: 123 mg/dL (ref 0.0–149.0)
VLDL: 24.6 mg/dL (ref 0.0–40.0)

## 2019-10-06 LAB — URINALYSIS, ROUTINE W REFLEX MICROSCOPIC
Bilirubin Urine: NEGATIVE
Hgb urine dipstick: NEGATIVE
Leukocytes,Ua: NEGATIVE
Nitrite: NEGATIVE
RBC / HPF: NONE SEEN
Specific Gravity, Urine: 1.025 (ref 1.000–1.030)
Total Protein, Urine: NEGATIVE
Urine Glucose: NEGATIVE
Urobilinogen, UA: 0.2 (ref 0.0–1.0)
pH: 6 (ref 5.0–8.0)

## 2019-10-06 MED ORDER — POLYETHYLENE GLYCOL 3350 17 GM/SCOOP PO POWD: ORAL | 1 refills | Status: AC

## 2019-10-06 MED ORDER — OMEPRAZOLE 40 MG PO CPDR: 40.0000 mg | DELAYED_RELEASE_CAPSULE | Freq: Every day | ORAL | 3 refills | Status: AC

## 2019-10-06 MED ORDER — ATORVASTATIN CALCIUM 20 MG PO TABS: 20.0000 mg | ORAL_TABLET | Freq: Every day | ORAL | 3 refills | Status: AC

## 2019-10-06 NOTE — Progress Notes (Signed)
Established Patient Office Visit  Subjective:  Patient ID: Jake Schultz, male    DOB: Jun 09, 1976  Age: 43 y.o. MRN: [id number]  CC:  Chief Complaint  Patient presents with  . right sided pain    HPI Jake Schultz presents for follow-up of multiple medical issues.  He has been lost to follow-up due to lack of health insurance.  Currently only working part-time.  He has not been taking any of his prescribed medicines.  Here today complaining of abdominal bloating with constipation.  There is pain on the left side of his abdomen.  He suffers from constipation.  There is been no weight loss night sweats blood or pus in his stool.  Of note he had a CT scan in 2019 that revealed early appendicitis but was otherwise negative.  He does have some burning up into the chest.  There is no chest pain diaphoresis nausea or vomiting at rest or with exertion.  He is walking for exercise  and does occasionally feel some shortness of breath when walking uphill.  He says that things are well at home.  He no longer feels depressed or anxious.  History of coronary artery disease status post stent placement.  He has been unable to take an aspirin due to stomach upset.  Plavix it caused a rash.  Past Medical History:  Diagnosis Date  . CAD (coronary artery disease)   . Chills 01/11/2019  . Numbness and tingling of both feet 01/11/2019  . Pain, dental 01/11/2019  . Weakness of both hands 01/11/2019    Past Surgical History:  Procedure Laterality Date  . heart stent    . LAPAROSCOPIC APPENDECTOMY N/A 11/26/2018   Procedure: APPENDECTOMY LAPAROSCOPIC;  Surgeon: Jimmye Norman, MD;  Location: [facility] OR;  Service: General;  Laterality: N/A;    Family History  Problem Relation Age of Onset  . Hypertension Mother   . Hypertension Father   . Diabetes Mellitus II Father     Social History   Socioeconomic History  . Marital status: Married    Spouse name: Not on file  . Number of children: Not on file  . Years of  education: Not on file  . Highest education level: Not on file  Occupational History  . Not on file  Social Needs  . Financial resource strain: Not on file  . Food insecurity    Worry: Not on file    Inability: Not on file  . Transportation needs    Medical: Not on file    Non-medical: Not on file  Tobacco Use  . Smoking status: Never Smoker  . Smokeless tobacco: Never Used  Substance and Sexual Activity  . Alcohol use: Yes    Comment: has 3 beers in a given setting on the weekends  . Drug use: Never  . Sexual activity: Not on file  Lifestyle  . Physical activity    Days per week: Not on file    Minutes per session: Not on file  . Stress: Not on file  Relationships  . Social Musician on phone: Not on file    Gets together: Not on file    Attends religious service: Not on file    Active member of club or organization: Not on file    Attends meetings of clubs or organizations: Not on file    Relationship status: Not on file  . Intimate partner violence    Fear of current or ex partner: Not on file  Emotionally abused: Not on file    Physically abused: Not on file    Forced sexual activity: Not on file  Other Topics Concern  . Not on file  Social History Narrative  . Not on file    Outpatient Medications Prior to Visit  Medication Sig Dispense Refill  . fluticasone (FLONASE) 50 MCG/ACT nasal spray Place 2 sprays into both nostrils daily. 16 g 6  . Multiple Vitamin (MULTIVITAMIN) tablet Take 1 tablet by mouth daily.    Marland Kitchen. atorvastatin (LIPITOR) 20 MG tablet Take 1 tablet (20 mg total) by mouth daily. 90 tablet 3  . escitalopram (LEXAPRO) 10 MG tablet Take 1 tablet (10 mg total) by mouth daily. 30 tablet 0  . omeprazole (PRILOSEC) 40 MG capsule Take 1 capsule (40 mg total) by mouth daily. (Patient taking differently: Take 40 mg by mouth as needed (heartburn/acid reflux). ) 30 capsule 3   No facility-administered medications prior to visit.     Allergies   Allergen Reactions  . Aspirin Other (See Comments)    Causes stomach issues  . Plavix [Clopidogrel Bisulfate] Other (See Comments)    Causes stomach problems    ROS Review of Systems  Constitutional: Negative.   HENT: Negative.   Eyes: Negative for photophobia and visual disturbance.  Respiratory: Positive for shortness of breath. Negative for cough, chest tightness and wheezing.   Cardiovascular: Negative for chest pain, palpitations and leg swelling.  Gastrointestinal: Positive for abdominal distention, abdominal pain and constipation. Negative for blood in stool, diarrhea, nausea, rectal pain and vomiting.  Genitourinary: Negative.   Musculoskeletal: Negative for gait problem and joint swelling.  Skin: Negative for color change and pallor.  Allergic/Immunologic: Negative for immunocompromised state.  Neurological: Negative for tremors and speech difficulty.  Hematological: Does not bruise/bleed easily.  Psychiatric/Behavioral: Negative for dysphoric mood. The patient is not nervous/anxious.    Depression screen [facility] 2/9 10/06/2019 07/14/2019 01/11/2019  Decreased Interest 0 0 0  Down, Depressed, Hopeless 0 0 0  PHQ - 2 Score 0 0 0  Altered sleeping 0 0 -  Tired, decreased energy 0 0 -  Change in appetite 0 0 -  Feeling bad or failure about yourself  0 0 -  Trouble concentrating 0 0 -  Moving slowly or fidgety/restless 0 0 -  Suicidal thoughts 0 0 -  PHQ-9 Score 0 0 -      Objective:    Physical Exam  Constitutional: He is oriented to person, place, and time. He appears well-developed. No distress.  HENT:  Head: Normocephalic and atraumatic.  Right Ear: External ear normal.  Left Ear: External ear normal.  Mouth/Throat: Oropharynx is clear and moist.  Eyes: Pupils are equal, round, and reactive to light. Conjunctivae are normal. Right eye exhibits no discharge. Left eye exhibits no discharge. No scleral icterus.  Neck: Neck supple. No JVD present. No tracheal deviation  present. No thyromegaly present.  Cardiovascular: Normal rate, regular rhythm and normal heart sounds.  Pulmonary/Chest: Effort normal and breath sounds normal. No stridor.  Abdominal: Soft. Bowel sounds are normal. He exhibits no distension. There is abdominal tenderness (non reproducible ). There is no rebound and no guarding.  Musculoskeletal:        General: No edema.  Lymphadenopathy:    He has no cervical adenopathy.  Neurological: He is alert and oriented to person, place, and time.  Skin: Skin is warm and dry. He is not diaphoretic.  Psychiatric: He has a normal mood and affect. His  behavior is normal.    BP 128/80   Pulse 82   Ht 5\' 8"  (1.727 m)   Wt 200 lb 4 oz (90.8 kg)   SpO2 97%   BMI 30.45 kg/m  Wt Readings from Last 3 Encounters:  10/06/19 200 lb 4 oz (90.8 kg)  07/14/19 205 lb 4 oz (93.1 kg)  02/28/19 185 lb (83.9 kg)   BP Readings from Last 3 Encounters:  10/06/19 128/80  07/14/19 (!) 138/98  02/28/19 120/81   Guideline developer:  UpToDate (see UpToDate for funding source) Date Released: June 2014  Health Maintenance Due  Topic Date Due  . July 2014  11/25/1993  . INFLUENZA VACCINE  07/29/2019    There are no preventive care reminders to display for this patient.  Lab Results  Component Value Date   TSH 3.10 12/08/2018   Lab Results  Component Value Date   WBC 10.4 02/28/2019   HGB 16.4 02/28/2019   HCT 46.2 02/28/2019   MCV 85.7 02/28/2019   PLT 188 02/28/2019   Lab Results  Component Value Date   NA 137 02/28/2019   K 3.7 02/28/2019   CO2 25 02/28/2019   GLUCOSE 120 (H) 02/28/2019   BUN 10 02/28/2019   CREATININE 1.04 02/28/2019   BILITOT 0.7 01/11/2019   ALKPHOS 63 01/05/2019   AST 27 01/11/2019   ALT 36 01/11/2019   PROT 7.4 01/11/2019   ALBUMIN 3.9 01/05/2019   CALCIUM 9.5 02/28/2019   ANIONGAP 8 02/28/2019   GFR 97.41 12/08/2018   Lab Results  Component Value Date   CHOL 209 (H) 12/08/2018   Lab Results  Component  Value Date   HDL 47.60 12/08/2018   Lab Results  Component Value Date   LDLCALC 139 (H) 12/08/2018   Lab Results  Component Value Date   TRIG 114.0 12/08/2018   Lab Results  Component Value Date   CHOLHDL 4 12/08/2018   Lab Results  Component Value Date   HGBA1C 5.0 01/11/2019      Assessment & Plan:   Problem List Items Addressed This Visit      Cardiovascular and Mediastinum   Coronary artery disease due to lipid rich plaque   Relevant Medications   atorvastatin (LIPITOR) 20 MG tablet   Other Relevant Orders   Comprehensive metabolic panel   CBC   Lipid panel   Urinalysis, Routine w reflex microscopic     Digestive   Slow transit constipation   Relevant Medications   polyethylene glycol powder (GLYCOLAX/MIRALAX) 17 GM/SCOOP powder   Reflux gastritis   Relevant Medications   omeprazole (PRILOSEC) 40 MG capsule     Other   Depression with anxiety - Primary   Non-compliant patient   Lactose intolerance      Meds ordered this encounter  Medications  . atorvastatin (LIPITOR) 20 MG tablet    Sig: Take 1 tablet (20 mg total) by mouth daily.    Dispense:  90 tablet    Refill:  3  . polyethylene glycol powder (GLYCOLAX/MIRALAX) 17 GM/SCOOP powder    Sig: Dissolve 1 scoop with 17 gms in water and use daily as needed for constipation.    Dispense:  578 g    Refill:  1  . omeprazole (PRILOSEC) 40 MG capsule    Sig: Take 1 capsule (40 mg total) by mouth daily.    Dispense:  30 capsule    Refill:  3    Follow-up: Return in about 3 months (around 01/06/2020), or if  symptoms worsen or fail to improve.    Patient will schedule follow-up also with cardiologist.  He will see me back in 3 months or sooner if his symptoms worsen or do not improve with the above recommended therapies.

## 2019-10-06 NOTE — Addendum Note (Signed)
Addended by: Lynnea Ferrier on: 10/06/2019 02:37 PM   Modules accepted: Orders

## 2019-11-22 ENCOUNTER — Telehealth: Payer: Self-pay

## 2019-11-22 NOTE — Telephone Encounter (Signed)
Copied from [place] [id number]. Topic: General - Other >> Nov 22, 2019  1:14 PM Jake Schultz wrote: Pt said he is having itching feet, being cold, and can't loose weight and is wondering if these symptoms may be related to having Thyroid issues

## 2019-11-27 NOTE — Telephone Encounter (Signed)
Yes, especially the cold sensitivity.

## 2019-11-27 NOTE — Telephone Encounter (Signed)
lmovm for pt to call the office back to set up an appointment to go over this in more detail if he would like, but left message per his dpr letting him know that it can be related.

## 2019-12-20 IMAGING — MR MR LUMBAR SPINE WO/W CM
4 of 7 series · 24 of 48 positions shown · IV contrast (Gadavist)
Comparison: CT abdomen and pelvis 01/06/2019.

CLINICAL DATA: Right side low back and abdominal pain for 4 months.
Left foot numbness.

EXAM:
MRI LUMBAR SPINE WITHOUT AND WITH CONTRAST
TECHNIQUE: Multiplanar and multiecho pulse sequences of the lumbar spine were
obtained without and with intravenous contrast.
CONTRAST:  9 cc Gadavist IV.

[Series 5: T2 · sagittal · 4.0mm · 0.73mm/px · 5 of 15 slices shown (1 of 2)]
[im 1/15]
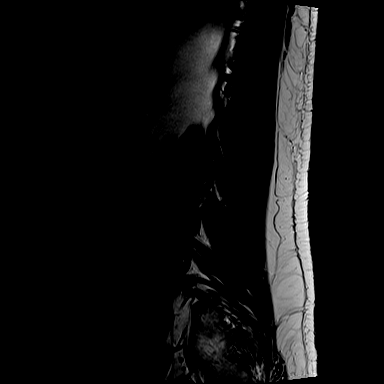
[im 4/15]
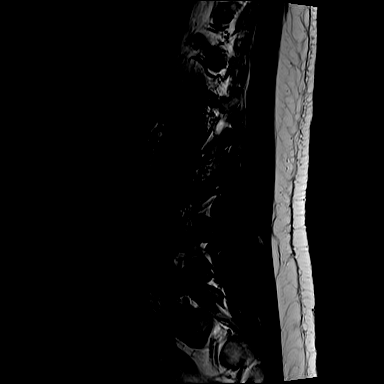
[im 8/15]
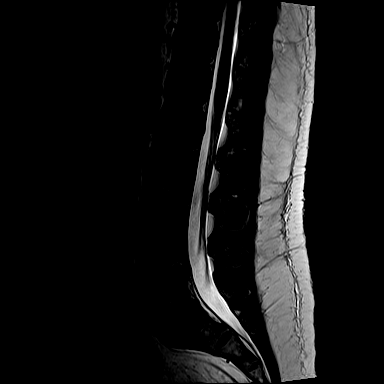
[im 11/15]
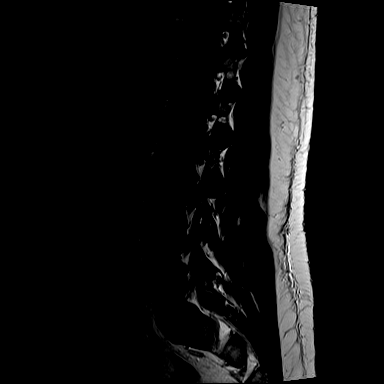
[im 15/15]
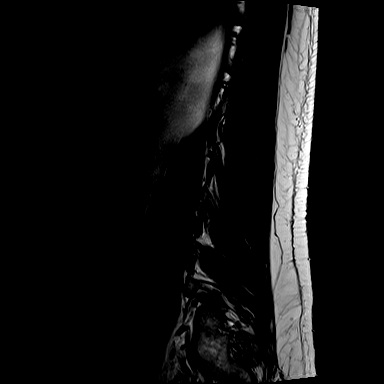

[Series 7: T1 · sagittal · 4.0mm · 0.88mm/px · 4 of 15 slices shown (1 of 2)]
[im 1/15]
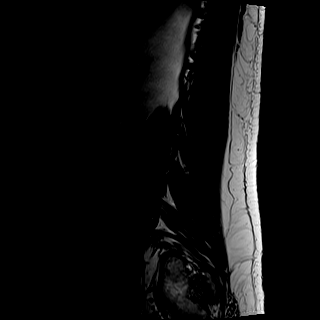
[im 5/15]
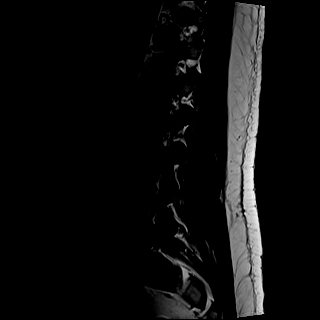
[im 10/15]
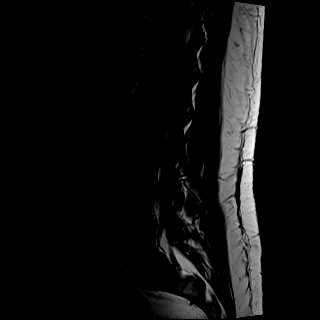
[im 15/15]
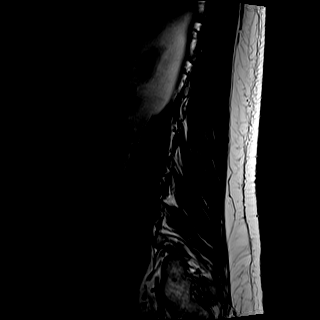

[Series 8: T2 · axial · 4.0mm · 0.57mm/px · z∈[-148,+47]mm · 8 of 34 slices shown (2 of 2)]
[im 1/34]
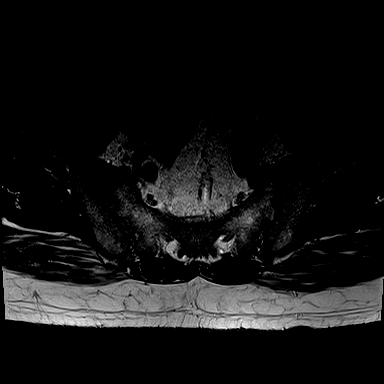
[im 4/34]
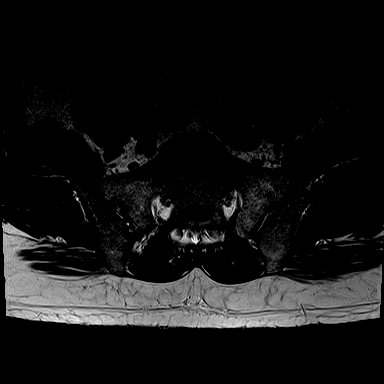
[im 12/34]
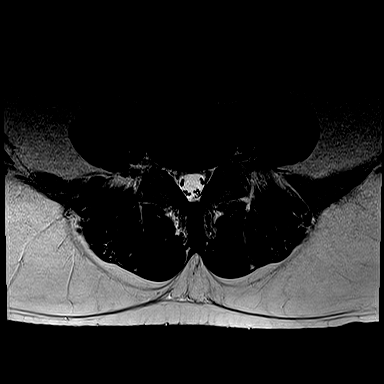
[im 15/34]
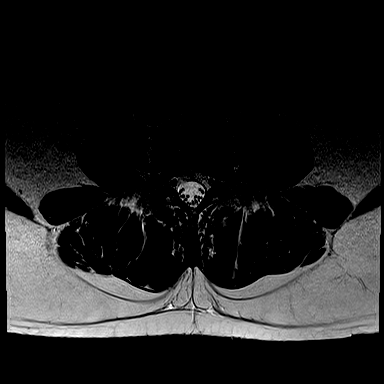
[im 19/34]
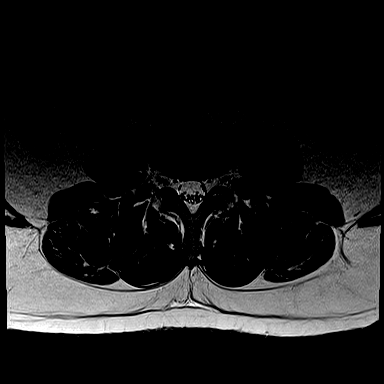
[im 23/34]
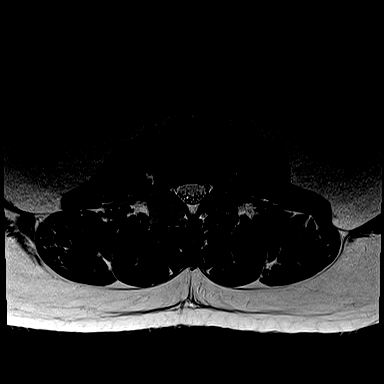
[im 30/34]
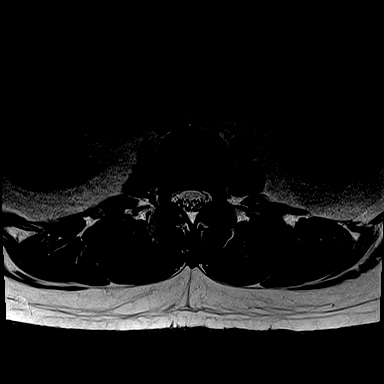
[im 34/34]
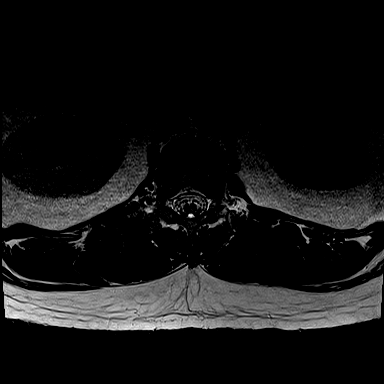

[Series 9: T1 · axial · 4.0mm · 0.34mm/px · z∈[-148,+27]mm · 7 of 34 slices shown (2 of 2)]
[im 1/34]
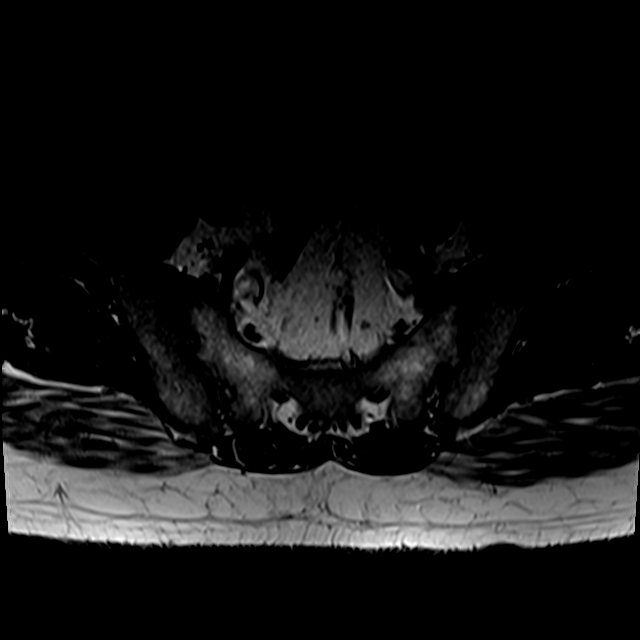
[im 4/34]
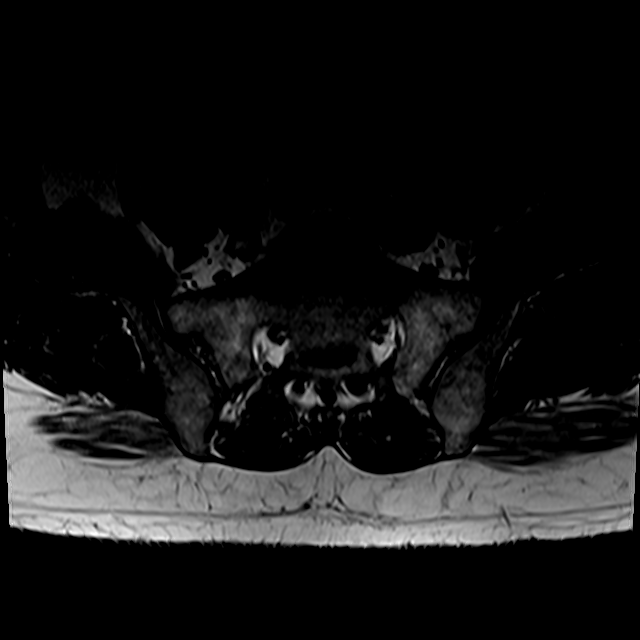
[im 12/34]
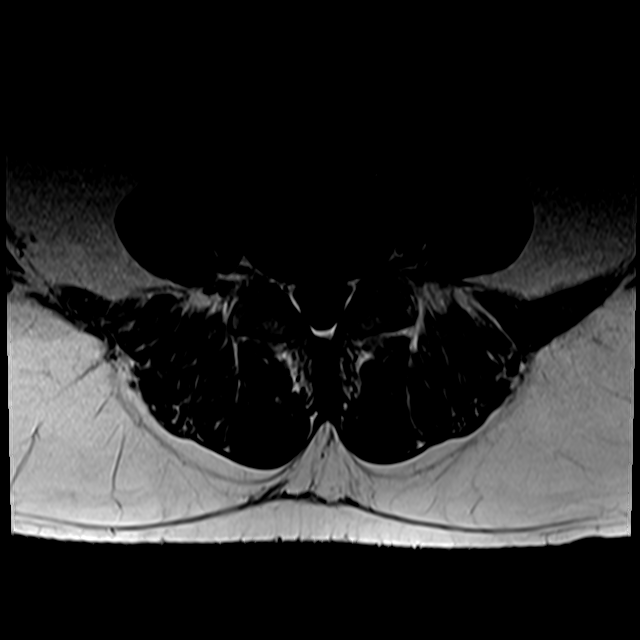
[im 15/34]
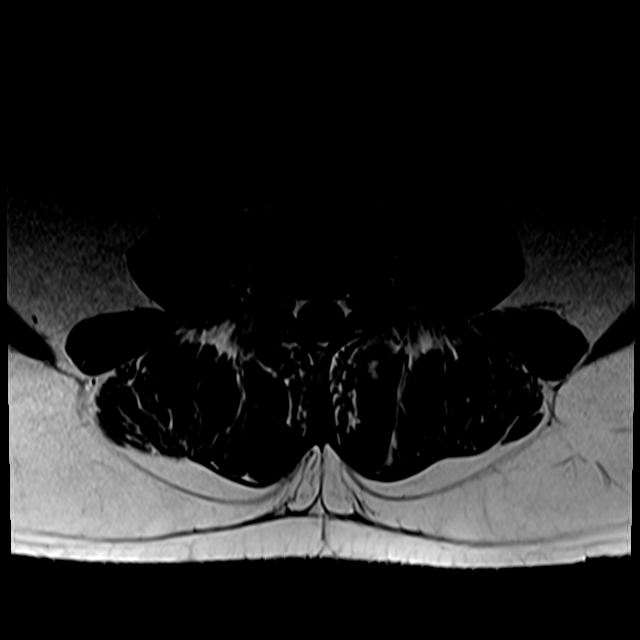
[im 19/34]
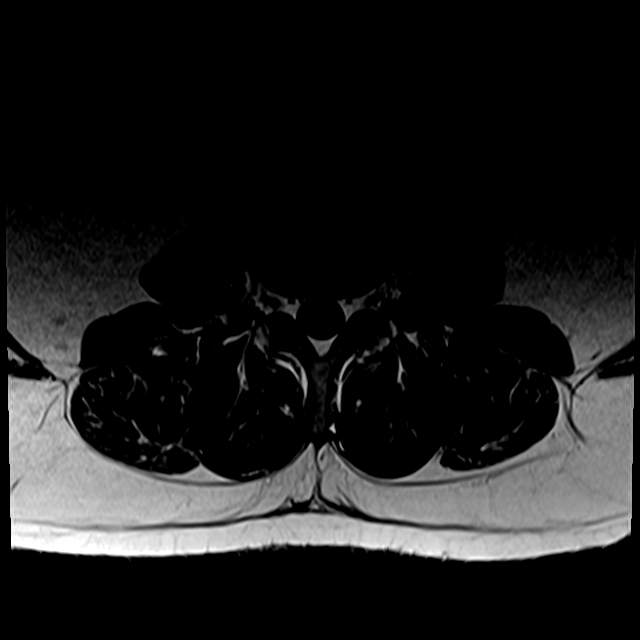
[im 23/34]
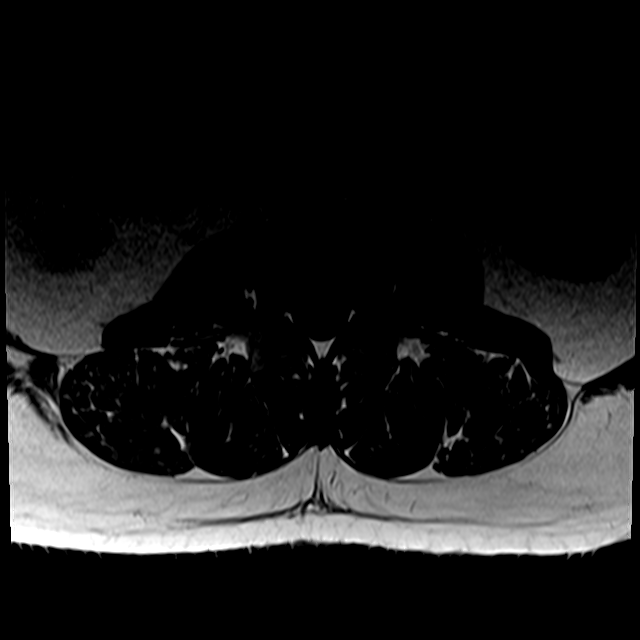
[im 30/34]
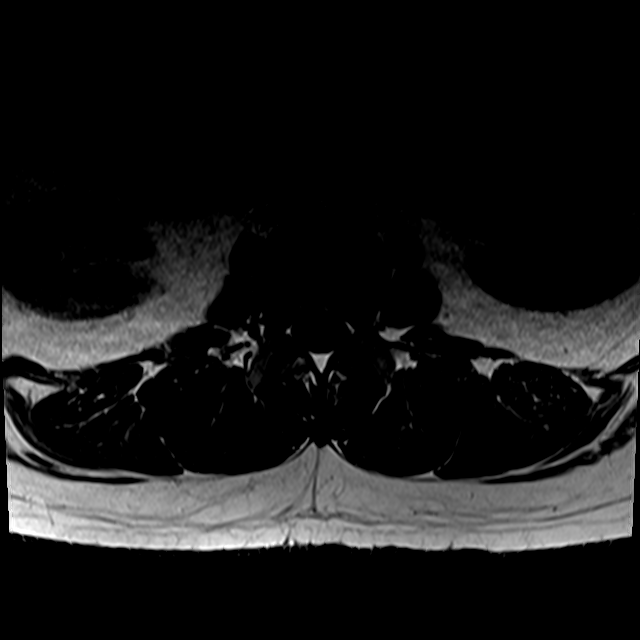

[24 of 48 positions shown; findings below may reference images not displayed]

FINDINGS: Segmentation:  Standard.

Alignment:  Trace anterolisthesis L5 on S1 noted.

Vertebrae: No fracture, evidence of discitis, or worrisome bone
lesion. Hemangioma in L3 incidentally noted.

Conus medullaris and cauda equina: Conus extends to the L1-2 level.
Conus and cauda equina appear normal.

Paraspinal and other soft tissues: Negative.

Disc levels:

T11-12 and T12-L1 are imaged in the sagittal plane only negative.

L1-2: Negative.

L2-3: Negative.

L3-4: Negative.

L4-5: There is a shallow broad-based central protrusion and mild
facet degenerative change. The central canal and foramina are widely
patent.

L5-S1: Mild-to-moderate facet degenerative change and a very shallow
disc bulge. No stenosis.
IMPRESSION: Mild lumbar spondylosis at L4-5 and L5-S1 without central canal
foraminal narrowing.

## 2020-12-11 ENCOUNTER — Other Ambulatory Visit: Payer: Self-pay

## 2020-12-11 ENCOUNTER — Emergency Department (HOSPITAL_COMMUNITY)
Admission: EM | Admit: 2020-12-11 | Discharge: 2020-12-11 | Disposition: A | Discharge status: Home / Self Care | Payer: BLUE CROSS/BLUE SHIELD | Attending: Emergency Medicine | Admitting: Emergency Medicine

## 2020-12-11 ENCOUNTER — Emergency Department (HOSPITAL_COMMUNITY): Payer: BLUE CROSS/BLUE SHIELD

## 2020-12-11 ENCOUNTER — Encounter (HOSPITAL_COMMUNITY): Payer: Self-pay | Admitting: Pediatrics

## 2020-12-11 DIAGNOSIS — R0600 Dyspnea, unspecified: Secondary | ICD-10-CM | POA: Diagnosis not present

## 2020-12-11 DIAGNOSIS — R202 Paresthesia of skin: Secondary | ICD-10-CM | POA: Insufficient documentation

## 2020-12-11 DIAGNOSIS — R079 Chest pain, unspecified: Secondary | ICD-10-CM | POA: Diagnosis not present

## 2020-12-11 DIAGNOSIS — R0602 Shortness of breath: Secondary | ICD-10-CM

## 2020-12-11 DIAGNOSIS — I2510 Atherosclerotic heart disease of native coronary artery without angina pectoris: Secondary | ICD-10-CM | POA: Insufficient documentation

## 2020-12-11 LAB — BASIC METABOLIC PANEL
Anion gap: 11 (ref 5–15)
BUN: 9 mg/dL (ref 6–20)
CO2: 22 mmol/L (ref 22–32)
Calcium: 9.4 mg/dL (ref 8.9–10.3)
Chloride: 103 mmol/L (ref 98–111)
Creatinine, Ser: 0.82 mg/dL (ref 0.61–1.24)
GFR, Estimated: >60 mL/min (ref >60)
Glucose, Bld: 117 mg/dL — ABNORMAL HIGH (ref 70–99)
Potassium: 3.8 mmol/L (ref 3.5–5.1)
Sodium: 136 mmol/L (ref 135–145)

## 2020-12-11 LAB — CBC
HCT: 46.3 % (ref 39.0–52.0)
Hemoglobin: 16.3 g/dL (ref 13.0–17.0)
MCH: 30.4 pg (ref 26.0–34.0)
MCHC: 35.2 g/dL (ref 30.0–36.0)
MCV: 86.2 fL (ref 80.0–100.0)
Platelets: UNDETERMINED 10*3/uL (ref 150–400)
RBC: 5.37 MIL/uL (ref 4.22–5.81)
RDW: 11.8 % (ref 11.5–15.5)
WBC: 8.7 10*3/uL (ref 4.0–10.5)
nRBC: 0 % (ref 0.0–0.2)

## 2020-12-11 LAB — TROPONIN I (HIGH SENSITIVITY)
Troponin I (High Sensitivity): 5 ng/L (ref <18)
Troponin I (High Sensitivity): 9 ng/L (ref <18)

## 2020-12-11 MED ORDER — SODIUM CHLORIDE 0.9 % IV BOLUS
1000.0000 mL | Freq: Once | INTRAVENOUS | Status: AC
  Administered 2020-12-11: 1000 mL via INTRAVENOUS

## 2020-12-11 MED ORDER — ALBUTEROL SULFATE HFA 108 (90 BASE) MCG/ACT IN AERS
2.0000 | INHALATION_SPRAY | Freq: Four times a day (QID) | RESPIRATORY_TRACT | Status: DC
  Filled 2020-12-11: qty 6.7

## 2020-12-11 MED ORDER — PREDNISONE 20 MG PO TABS
60.0000 mg | ORAL_TABLET | ORAL | Status: AC
  Administered 2020-12-11: 23:00:00 60 mg via ORAL
  Filled 2020-12-11: qty 3

## 2020-12-11 MED ORDER — IOHEXOL 350 MG/ML SOLN
100.0000 mL | Freq: Once | INTRAVENOUS | Status: AC | PRN
  Administered 2020-12-11: 100 mL via INTRAVENOUS

## 2020-12-11 MED ORDER — PREDNISONE 20 MG PO TABS: 40.0000 mg | ORAL_TABLET | Freq: Every day | ORAL | 0 refills | Status: AC

## 2020-12-11 NOTE — ED Triage Notes (Signed)
Patient c/o chest tightness and shortness of breath. Stated he was recently tested + for covid about a month ago, and has an a negative test afterwards. Patient stated he's seen his PCP for repeat CXR and labs and he is concern for blood clot. Patient then stated since his appendix removal 3 years ago his been having problems with his legs since then, some numbness.

## 2020-12-11 NOTE — ED Provider Notes (Signed)
[facility] EMERGENCY DEPARTMENT Provider Note   CSN: [id number] Arrival date & time: 12/11/20  1413     History Chief Complaint  Patient presents with  . Shortness of Breath  . Chest Pain  . Numbness    Jake Schultz is a 44 y.o. male.  HPI Patient presents 1 month after Covid infection, now with concern for dyspnea, chest pain.  Pain is worse with ambulation, normal respiratory described, with associated dyspnea No syncope, fever. Patient has a history of appendicitis, is generally well. He was diagnosed with Covid about 1 month ago, recovered, seemingly uneventful home, but about 1 week ago noticed worsening/more noticeable dyspnea, chest pain. No lower extremity edema, asymmetry.  Patient never smoked Impression:     Past Medical History:  Diagnosis Date  . CAD (coronary artery disease)   . Chills 01/11/2019  . Numbness and tingling of both feet 01/11/2019  . Pain, dental 01/11/2019  . Weakness of both hands 01/11/2019    Patient Active Problem List   Diagnosis Date Noted  . Non-compliant patient 10/06/2019  . Slow transit constipation 10/06/2019  . Lactose intolerance 10/06/2019  . Reflux gastritis 10/06/2019  . Arthralgia of left temporomandibular joint 07/14/2019  . Congestion of left ear 07/14/2019  . Elevated BP without diagnosis of hypertension 07/14/2019  . Lymphocytosis 02/15/2019  . Polycythemia 02/15/2019  . Xerosis cutis 01/13/2019  . Numbness and tingling of both feet 01/11/2019  . Weakness of both hands 01/11/2019  . Chills 01/11/2019  . Pain, dental 01/11/2019  . Depression with anxiety 12/12/2018  . Coronary artery disease due to lipid rich plaque 12/06/2018  . Paresthesias 12/06/2018  . Gastroesophageal reflux disease 12/06/2018  . Appendicitis 11/25/2018    Past Surgical History:  Procedure Laterality Date  . heart stent    . LAPAROSCOPIC APPENDECTOMY N/A 11/26/2018   Procedure: APPENDECTOMY LAPAROSCOPIC;  Surgeon:  Jimmye Norman, MD;  Location: [facility] OR;  Service: General;  Laterality: N/A;       Family History  Problem Relation Age of Onset  . Hypertension Mother   . Hypertension Father   . Diabetes Mellitus II Father     Social History   Tobacco Use  . Smoking status: Never Smoker  . Smokeless tobacco: Never Used  Substance Use Topics  . Alcohol use: Yes    Comment: has 3 beers in a given setting on the weekends  . Drug use: Never    Home Medications Prior to Admission medications   Medication Sig Start Date End Date Taking? Authorizing Provider  atorvastatin (LIPITOR) 20 MG tablet Take 1 tablet (20 mg total) by mouth daily. 10/06/19   Mliss Sax, MD  fluticasone [facility]) 50 MCG/ACT nasal spray Place 2 sprays into both nostrils daily. 07/14/19   Mliss Sax, MD  Multiple Vitamin (MULTIVITAMIN) tablet Take 1 tablet by mouth daily.    [provider]  omeprazole (PRILOSEC) 40 MG capsule Take 1 capsule (40 mg total) by mouth daily. 10/06/19   Mliss Sax, MD  polyethylene glycol powder [facility]) 17 GM/SCOOP powder Dissolve 1 scoop with 17 gms in water and use daily as needed for constipation. 10/06/19   Mliss Sax, MD  predniSONE (DELTASONE) 20 MG tablet Take 2 tablets (40 mg total) by mouth daily with breakfast. For the next four days 12/11/20   Gerhard Munch, MD    Allergies    Aspirin and Plavix [clopidogrel bisulfate]  Review of Systems   Review of Systems  Constitutional:  Per HPI, otherwise negative  HENT:       Per HPI, otherwise negative  Respiratory:       Per HPI, otherwise negative  Cardiovascular:       Per HPI, otherwise negative  Gastrointestinal: Negative for vomiting.  Endocrine:       Negative aside from HPI  Genitourinary:       Neg aside from HPI   Musculoskeletal:       Per HPI, otherwise negative  Skin: Negative.   Neurological: Negative for syncope.    Physical Exam Updated Vital  Signs BP (!) 135/92   Pulse 86   Temp 98.4 F (36.9 C) (Oral)   Resp 18   SpO2 97%   Physical Exam Vitals and nursing note reviewed.  Constitutional:      General: He is not in acute distress.    Appearance: He is well-developed.  HENT:     Head: Normocephalic and atraumatic.  Eyes:     Extraocular Movements: EOM normal.     Conjunctiva/sclera: Conjunctivae normal.  Cardiovascular:     Rate and Rhythm: Normal rate and regular rhythm.  Pulmonary:     Effort: Pulmonary effort is normal. No respiratory distress.     Breath sounds: No stridor.  Abdominal:     General: There is no distension.  Musculoskeletal:        General: No edema.  Skin:    General: Skin is warm and dry.  Neurological:     Mental Status: He is alert and oriented to person, place, and time.  Psychiatric:        Mood and Affect: Mood and affect normal.      ED Results / Procedures / Treatments   Labs (all labs ordered are listed, but only abnormal results are displayed) Labs Reviewed  BASIC METABOLIC PANEL - Abnormal; Notable for the following components:      Result Value   Glucose, Bld 117 (*)    All other components within normal limits  CBC  TROPONIN I (HIGH SENSITIVITY)  TROPONIN I (HIGH SENSITIVITY)    EKG EKG Interpretation  Date/Time:  Wednesday December 11 2020 14:23:10 EST Ventricular Rate:  91 PR Interval:  132 QRS Duration: 104 QT Interval:  338 QTC Calculation: 415 R Axis:   78 Text Interpretation: Normal sinus rhythm Incomplete right bundle branch block Nonspecific T wave abnormality Abnormal ECG Confirmed by Gerhard Munch [phone]) on 12/11/2020 8:59:39 PM   Radiology DG Chest 2 View  Result Date: 12/11/2020 CLINICAL DATA:  Chest pain EXAM: CHEST - 2 VIEW COMPARISON:  12/04/2020 FINDINGS: Heart and mediastinal shadows are normal. The lungs are clear. Pulmonary vascularity is normal. No effusions. Ordinary mild degenerative changes affect the spine. 1 could question mild  central bronchial thickening but there is no infiltrate, collapse or effusion. IMPRESSION: Possible mild central bronchial thickening. No consolidation or collapse. Electronically Signed   By: Paulina Fusi M.D.   On: 12/11/2020 15:01   CT Angio Chest PE W/Cm &/Or Wo Cm  Result Date: 12/11/2020 CLINICAL DATA:  Shortness of breath, chest tightness EXAM: CT ANGIOGRAPHY CHEST WITH CONTRAST TECHNIQUE: Multidetector CT imaging of the chest was performed using the standard protocol during bolus administration of intravenous contrast. Multiplanar CT image reconstructions and MIPs were obtained to evaluate the vascular anatomy. CONTRAST:  OMNIPAQUE IOHEXOL 350 MG/ML SOLN COMPARISON:  11/30/2017 FINDINGS: Cardiovascular: No filling defects in the pulmonary arteries to suggest pulmonary emboli. Heart is normal size. Aorta is normal caliber. Mediastinum/Nodes:  No mediastinal, hilar, or axillary adenopathy. Trachea and esophagus are unremarkable. Thyroid unremarkable. Lungs/Pleura: Lungs are clear. No focal airspace opacities or suspicious nodules. No effusions. Upper Abdomen: Imaging into the upper abdomen demonstrates no acute findings. Musculoskeletal: Chest wall soft tissues are unremarkable. No acute bony abnormality. Review of the MIP images confirms the above findings. IMPRESSION: No evidence of pulmonary embolus. No acute or significant extracardiac abnormality. Electronically Signed   By: Charlett Nose M.D.   On: 12/11/2020 22:38    Procedures Procedures (including critical care time)  Medications Ordered in ED Medications  albuterol (VENTOLIN HFA) 108 (90 Base) MCG/ACT inhaler 2 puff (has no administration in time range)  predniSONE (DELTASONE) tablet 60 mg (has no administration in time range)  sodium chloride 0.9 % bolus 1,000 mL (1,000 mLs Intravenous New Bag/Given 12/11/20 2206)  iohexol (OMNIPAQUE) 350 MG/ML injection 100 mL (100 mLs Intravenous Contrast Given 12/11/20 2222)    ED Course   I have reviewed the triage vital signs and the nursing notes.  Pertinent labs & imaging results that were available during my care of the patient were reviewed by me and considered in my medical decision making (see chart for details).    MDM Rules/Calculators/A&P                         MDM Number of Diagnoses or Management Options Shortness of breath: established, worsening   Amount and/or Complexity of Data Reviewed Clinical lab tests: reviewed Tests in the radiology section of CPT: reviewed Tests in the medicine section of CPT: reviewed Decide to obtain previous medical records or to obtain history from someone other than the patient: yes Review and summarize past medical records: yes Independent visualization of images, tracings, or specimens: yes  Risk of Complications, Morbidity, and/or Mortality Presenting problems: high Diagnostic procedures: high Management options: high  Critical Care Total time providing critical care: < 30 minutes  Patient Progress Patient progress: stable  10:54 PM Patient in no distress, awake, alert. He is not tachycardic, has no hypoxia, no increased work of breathing [place] discussed findings, concerning for likely bronchitis, no evidence for pulmonary embolism, no evidence for pneumonia. Some suspicion for the patient currently is contributing to his inflammatory condition, but absent evidence for hemodynamic compromise, pneumonia, ongoing substantial chest pain, patient is appropriate for discharge with outpatient follow-up after initiation of steroids, bronchodilators. Final Clinical Impression(s) / ED Diagnoses Final diagnoses:  Shortness of breath    Rx / DC Orders ED Discharge Orders         Ordered    predniSONE (DELTASONE) 20 MG tablet  Daily with breakfast        12/11/20 2247           Gerhard Munch, MD 12/11/20 2256

## 2020-12-11 NOTE — ED Notes (Signed)
Transported to CT 

## 2020-12-11 NOTE — Discharge Instructions (Signed)
As discussed, your evaluation today has been largely reassuring.  But, it is important that you monitor your condition carefully, and do not hesitate to return to the ED if you develop new, or concerning changes in your condition.  Please use the provided inhaler every 4 hours for the next 2 days, then as needed.  Please obtain and use the prescribed steroids for the next 4 days.  Otherwise, please follow-up with your physician for appropriate ongoing care.

## 2020-12-11 NOTE — ED Notes (Signed)
Reviewed discharge instructions with patient. Follow-up care and medications reviewed. Patient  verbalized understanding. Patient A&Ox4, VSS, and ambulatory with steady gait upon discharge.  °

## 2024-11-20 DIAGNOSIS — H9201 Otalgia, right ear: Secondary | ICD-10-CM | POA: Diagnosis not present

## 2024-11-20 DIAGNOSIS — H6501 Acute serous otitis media, right ear: Secondary | ICD-10-CM | POA: Diagnosis not present
